# Patient Record
Sex: Male | Born: 1985
Health system: Southern US, Community
[De-identification: ages and names within clinical notes are randomized; demographics above are authoritative.]

---

## 2018-09-25 ENCOUNTER — Emergency Department (HOSPITAL_COMMUNITY): Payer: Self-pay

## 2018-09-25 ENCOUNTER — Inpatient Hospital Stay (HOSPITAL_COMMUNITY): Payer: Self-pay

## 2018-09-25 ENCOUNTER — Inpatient Hospital Stay (HOSPITAL_COMMUNITY)
Admission: EM | Admit: 2018-09-25 | Discharge: 2018-09-29 | DRG: 917 | Disposition: A | Discharge status: Home / Self Care | Payer: Self-pay | Attending: Internal Medicine | Admitting: Internal Medicine

## 2018-09-25 DIAGNOSIS — E872 Acidosis: Secondary | ICD-10-CM | POA: Diagnosis present

## 2018-09-25 DIAGNOSIS — T510X1A Toxic effect of ethanol, accidental (unintentional), initial encounter: Secondary | ICD-10-CM | POA: Diagnosis present

## 2018-09-25 DIAGNOSIS — T50901A Poisoning by unspecified drugs, medicaments and biological substances, accidental (unintentional), initial encounter: Secondary | ICD-10-CM

## 2018-09-25 DIAGNOSIS — F1410 Cocaine abuse, uncomplicated: Secondary | ICD-10-CM | POA: Diagnosis present

## 2018-09-25 DIAGNOSIS — E874 Mixed disorder of acid-base balance: Secondary | ICD-10-CM | POA: Diagnosis present

## 2018-09-25 DIAGNOSIS — T405X1A Poisoning by cocaine, accidental (unintentional), initial encounter: Principal | ICD-10-CM | POA: Diagnosis present

## 2018-09-25 DIAGNOSIS — R402112 Coma scale, eyes open, never, at arrival to emergency department: Secondary | ICD-10-CM | POA: Diagnosis present

## 2018-09-25 DIAGNOSIS — J80 Acute respiratory distress syndrome: Secondary | ICD-10-CM | POA: Diagnosis present

## 2018-09-25 DIAGNOSIS — Z888 Allergy status to other drugs, medicaments and biological substances status: Secondary | ICD-10-CM

## 2018-09-25 DIAGNOSIS — J9601 Acute respiratory failure with hypoxia: Secondary | ICD-10-CM | POA: Diagnosis present

## 2018-09-25 DIAGNOSIS — J690 Pneumonitis due to inhalation of food and vomit: Secondary | ICD-10-CM | POA: Diagnosis present

## 2018-09-25 DIAGNOSIS — Z1159 Encounter for screening for other viral diseases: Secondary | ICD-10-CM

## 2018-09-25 DIAGNOSIS — R402212 Coma scale, best verbal response, none, at arrival to emergency department: Secondary | ICD-10-CM | POA: Diagnosis present

## 2018-09-25 DIAGNOSIS — Z9911 Dependence on respirator [ventilator] status: Secondary | ICD-10-CM

## 2018-09-25 DIAGNOSIS — Z452 Encounter for adjustment and management of vascular access device: Secondary | ICD-10-CM

## 2018-09-25 DIAGNOSIS — I10 Essential (primary) hypertension: Secondary | ICD-10-CM | POA: Diagnosis present

## 2018-09-25 DIAGNOSIS — Y905 Blood alcohol level of 100-119 mg/100 ml: Secondary | ICD-10-CM | POA: Diagnosis present

## 2018-09-25 DIAGNOSIS — Y9259 Other trade areas as the place of occurrence of the external cause: Secondary | ICD-10-CM

## 2018-09-25 DIAGNOSIS — R402312 Coma scale, best motor response, none, at arrival to emergency department: Secondary | ICD-10-CM | POA: Diagnosis present

## 2018-09-25 DIAGNOSIS — N179 Acute kidney failure, unspecified: Secondary | ICD-10-CM | POA: Diagnosis present

## 2018-09-25 DIAGNOSIS — T407X1A Poisoning by cannabis (derivatives), accidental (unintentional), initial encounter: Secondary | ICD-10-CM | POA: Diagnosis present

## 2018-09-25 LAB — TRIGLYCERIDES: Triglycerides: 92 mg/dL (ref <150)

## 2018-09-25 LAB — MAGNESIUM: Magnesium: 1.6 mg/dL — ABNORMAL LOW (ref 1.7–2.4)

## 2018-09-25 LAB — COMPREHENSIVE METABOLIC PANEL
ALT: 23 U/L (ref 0–44)
ALT: 24 U/L (ref 0–44)
AST: 33 U/L (ref 15–41)
AST: 33 U/L (ref 15–41)
Albumin: 3.9 g/dL (ref 3.5–5.0)
Albumin: 3.6 g/dL (ref 3.5–5.0)
Alkaline Phosphatase: 82 U/L (ref 38–126)
Alkaline Phosphatase: 73 U/L (ref 38–126)
Anion gap: 17 — ABNORMAL HIGH (ref 5–15)
Anion gap: 9 (ref 5–15)
BUN: 13 mg/dL (ref 6–20)
BUN: 13 mg/dL (ref 6–20)
CO2: 19 mmol/L — ABNORMAL LOW (ref 22–32)
CO2: 23 mmol/L (ref 22–32)
Calcium: 8.8 mg/dL — ABNORMAL LOW (ref 8.9–10.3)
Calcium: 8.7 mg/dL — ABNORMAL LOW (ref 8.9–10.3)
Chloride: 107 mmol/L (ref 98–111)
Chloride: 110 mmol/L (ref 98–111)
Creatinine, Ser: 1.29 mg/dL — ABNORMAL HIGH (ref 0.61–1.24)
Creatinine, Ser: 1.31 mg/dL — ABNORMAL HIGH (ref 0.61–1.24)
GFR calc Af Amer: >60 mL/min (ref >60)
GFR calc Af Amer: >60 mL/min (ref >60)
GFR calc non Af Amer: >60 mL/min (ref >60)
GFR calc non Af Amer: >60 mL/min (ref >60)
Glucose, Bld: 241 mg/dL — ABNORMAL HIGH (ref 70–99)
Glucose, Bld: 101 mg/dL — ABNORMAL HIGH (ref 70–99)
Potassium: 4 mmol/L (ref 3.5–5.1)
Potassium: 4.8 mmol/L (ref 3.5–5.1)
Sodium: 143 mmol/L (ref 135–145)
Sodium: 142 mmol/L (ref 135–145)
Total Bilirubin: 0.5 mg/dL (ref 0.3–1.2)
Total Bilirubin: 0.6 mg/dL (ref 0.3–1.2)
Total Protein: 7.1 g/dL (ref 6.5–8.1)
Total Protein: 6.6 g/dL (ref 6.5–8.1)

## 2018-09-25 LAB — POCT I-STAT 7, (LYTES, BLD GAS, ICA,H+H)
Acid-base deficit: 11 mmol/L — ABNORMAL HIGH (ref 0.0–2.0)
Acid-base deficit: 9 mmol/L — ABNORMAL HIGH (ref 0.0–2.0)
Bicarbonate: 17.7 mmol/L — ABNORMAL LOW (ref 20.0–28.0)
Bicarbonate: 17.3 mmol/L — ABNORMAL LOW (ref 20.0–28.0)
Calcium, Ion: 1.26 mmol/L (ref 1.15–1.40)
Calcium, Ion: 1.24 mmol/L (ref 1.15–1.40)
HCT: 53 % — ABNORMAL HIGH (ref 39.0–52.0)
HCT: 53 % — ABNORMAL HIGH (ref 39.0–52.0)
Hemoglobin: 18 g/dL — ABNORMAL HIGH (ref 13.0–17.0)
Hemoglobin: 18 g/dL — ABNORMAL HIGH (ref 13.0–17.0)
O2 Saturation: 88 %
O2 Saturation: 97 %
Potassium: 4.4 mmol/L (ref 3.5–5.1)
Potassium: 4.6 mmol/L (ref 3.5–5.1)
Sodium: 141 mmol/L (ref 135–145)
Sodium: 140 mmol/L (ref 135–145)
TCO2: 19 mmol/L — ABNORMAL LOW (ref 22–32)
TCO2: 18 mmol/L — ABNORMAL LOW (ref 22–32)
pCO2 arterial: 48.6 mmHg — ABNORMAL HIGH (ref 32.0–48.0)
pCO2 arterial: 36.9 mmHg (ref 32.0–48.0)
pH, Arterial: 7.168 — CL (ref 7.350–7.450)
pH, Arterial: 7.279 — ABNORMAL LOW (ref 7.350–7.450)
pO2, Arterial: 70 mmHg — ABNORMAL LOW (ref 83.0–108.0)
pO2, Arterial: 100 mmHg (ref 83.0–108.0)

## 2018-09-25 LAB — CBC
HCT: 53.2 % — ABNORMAL HIGH (ref 39.0–52.0)
Hemoglobin: 16.9 g/dL (ref 13.0–17.0)
MCH: 29 pg (ref 26.0–34.0)
MCHC: 31.8 g/dL (ref 30.0–36.0)
MCV: 91.4 fL (ref 80.0–100.0)
Platelets: 199 10*3/uL (ref 150–400)
RBC: 5.82 MIL/uL — ABNORMAL HIGH (ref 4.22–5.81)
RDW: 14.5 % (ref 11.5–15.5)
WBC: 5.5 10*3/uL (ref 4.0–10.5)
nRBC: 0 % (ref 0.0–0.2)

## 2018-09-25 LAB — CBC WITH DIFFERENTIAL/PLATELET
Abs Immature Granulocytes: 0.29 10*3/uL — ABNORMAL HIGH (ref 0.00–0.07)
Basophils Absolute: 0.1 10*3/uL (ref 0.0–0.1)
Basophils Relative: 1 %
Eosinophils Absolute: 0.2 10*3/uL (ref 0.0–0.5)
Eosinophils Relative: 1 %
HCT: 50.4 % (ref 39.0–52.0)
Hemoglobin: 15.6 g/dL (ref 13.0–17.0)
Immature Granulocytes: 1 %
Lymphocytes Relative: 15 %
Lymphs Abs: 3 10*3/uL (ref 0.7–4.0)
MCH: 29 pg (ref 26.0–34.0)
MCHC: 31 g/dL (ref 30.0–36.0)
MCV: 93.7 fL (ref 80.0–100.0)
Monocytes Absolute: 0.3 10*3/uL (ref 0.1–1.0)
Monocytes Relative: 2 %
Neutro Abs: 16.7 10*3/uL — ABNORMAL HIGH (ref 1.7–7.7)
Neutrophils Relative %: 80 %
Platelets: 198 10*3/uL (ref 150–400)
RBC: 5.38 MIL/uL (ref 4.22–5.81)
RDW: 14.5 % (ref 11.5–15.5)
WBC: 20.7 10*3/uL — ABNORMAL HIGH (ref 4.0–10.5)
nRBC: 0 % (ref 0.0–0.2)

## 2018-09-25 LAB — PROTIME-INR
INR: 1.1 (ref 0.8–1.2)
INR: 1.1 (ref 0.8–1.2)
Prothrombin Time: 13.7 seconds (ref 11.4–15.2)
Prothrombin Time: 13.8 seconds (ref 11.4–15.2)

## 2018-09-25 LAB — RAPID URINE DRUG SCREEN, HOSP PERFORMED
Amphetamines: NOT DETECTED
Barbiturates: NOT DETECTED
Benzodiazepines: NOT DETECTED
Cocaine: POSITIVE — AB
Opiates: NOT DETECTED
Tetrahydrocannabinol: POSITIVE — AB

## 2018-09-25 LAB — PROCALCITONIN: Procalcitonin: 1.43 ng/mL

## 2018-09-25 LAB — APTT
aPTT: 27 seconds (ref 24–36)
aPTT: 25 seconds (ref 24–36)

## 2018-09-25 LAB — TYPE AND SCREEN
ABO/RH(D): O POS
Antibody Screen: NEGATIVE

## 2018-09-25 LAB — SARS CORONAVIRUS 2 BY RT PCR (HOSPITAL ORDER, PERFORMED IN ~~LOC~~ HOSPITAL LAB): SARS Coronavirus 2: NEGATIVE

## 2018-09-25 LAB — LACTIC ACID, PLASMA
Lactic Acid, Venous: 5.8 mmol/L (ref 0.5–1.9)
Lactic Acid, Venous: 3.6 mmol/L (ref 0.5–1.9)
Lactic Acid, Venous: 2.8 mmol/L (ref 0.5–1.9)

## 2018-09-25 LAB — ETHANOL: Alcohol, Ethyl (B): 109 mg/dL — ABNORMAL HIGH (ref <10)

## 2018-09-25 LAB — PHOSPHORUS: Phosphorus: 3.5 mg/dL (ref 2.5–4.6)

## 2018-09-25 LAB — AMYLASE: Amylase: 116 U/L — ABNORMAL HIGH (ref 28–100)

## 2018-09-25 LAB — MRSA PCR SCREENING: MRSA by PCR: NEGATIVE

## 2018-09-25 LAB — CORTISOL: Cortisol, Plasma: 18.7 ug/dL

## 2018-09-25 LAB — ABO/RH: ABO/RH(D): O POS

## 2018-09-25 MED ORDER — PROPOFOL 1000 MG/100ML IV EMUL
INTRAVENOUS | Status: AC
  Filled 2018-09-25: qty 100

## 2018-09-25 MED ORDER — SODIUM CHLORIDE 0.9 % IV SOLN
3.0000 g | Freq: Four times a day (QID) | INTRAVENOUS | Status: DC
  Administered 2018-09-25 – 2018-09-29 (×16): 3 g via INTRAVENOUS
  Filled 2018-09-25 (×2): qty 3
  Filled 2018-09-25 (×3): qty 8
  Filled 2018-09-25 (×2): qty 3
  Filled 2018-09-25: qty 8
  Filled 2018-09-25 (×3): qty 3
  Filled 2018-09-25 (×2): qty 8
  Filled 2018-09-25: qty 3
  Filled 2018-09-25: qty 8
  Filled 2018-09-25 (×2): qty 3
  Filled 2018-09-25: qty 8
  Filled 2018-09-25 (×2): qty 3
  Filled 2018-09-25: qty 8

## 2018-09-25 MED ORDER — CHLORHEXIDINE GLUCONATE CLOTH 2 % EX PADS
6.0000 | MEDICATED_PAD | Freq: Every day | CUTANEOUS | Status: DC
  Administered 2018-09-25 (×2): 6 via TOPICAL

## 2018-09-25 MED ORDER — ORAL CARE MOUTH RINSE
15.0000 mL | OROMUCOSAL | Status: DC
  Administered 2018-09-25 – 2018-09-26 (×13): 15 mL via OROMUCOSAL

## 2018-09-25 MED ORDER — ACETAMINOPHEN 325 MG PO TABS
650.0000 mg | ORAL_TABLET | ORAL | Status: DC | PRN
  Administered 2018-09-25: 650 mg via ORAL
  Filled 2018-09-25: qty 2

## 2018-09-25 MED ORDER — FENTANYL CITRATE (PF) 100 MCG/2ML IJ SOLN: 50.0000 ug | Freq: Once | INTRAMUSCULAR | Status: DC

## 2018-09-25 MED ORDER — FENTANYL 2500MCG IN NS 250ML (10MCG/ML) PREMIX INFUSION
25.0000 ug/h | INTRAVENOUS | Status: DC
  Administered 2018-09-25: 100 ug/h via INTRAVENOUS

## 2018-09-25 MED ORDER — SODIUM CHLORIDE 0.9 % IV SOLN
INTRAVENOUS | Status: DC
  Administered 2018-09-25 – 2018-09-27 (×2): via INTRAVENOUS

## 2018-09-25 MED ORDER — ROCURONIUM BROMIDE 50 MG/5ML IV SOLN
INTRAVENOUS | Status: AC | PRN
  Administered 2018-09-25: 100 mg via INTRAVENOUS

## 2018-09-25 MED ORDER — ETOMIDATE 2 MG/ML IV SOLN
INTRAVENOUS | Status: AC | PRN
  Administered 2018-09-25: 25 mg via INTRAVENOUS

## 2018-09-25 MED ORDER — FAMOTIDINE IN NACL 20-0.9 MG/50ML-% IV SOLN
20.0000 mg | Freq: Two times a day (BID) | INTRAVENOUS | Status: DC
  Administered 2018-09-25 – 2018-09-26 (×3): 20 mg via INTRAVENOUS
  Filled 2018-09-25 (×3): qty 50

## 2018-09-25 MED ORDER — NALOXONE HCL 2 MG/2ML IJ SOSY
2.0000 mg | PREFILLED_SYRINGE | Freq: Once | INTRAMUSCULAR | Status: AC
  Administered 2018-09-25: 09:00:00 2 mg via INTRAVENOUS

## 2018-09-25 MED ORDER — PROPOFOL 1000 MG/100ML IV EMUL
0.0000 ug/kg/min | INTRAVENOUS | Status: DC
  Administered 2018-09-25: 09:00:00 5 ug/kg/min via INTRAVENOUS
  Administered 2018-09-25: 15:00:00 30 ug/kg/min via INTRAVENOUS
  Administered 2018-09-26: 04:00:00 40 ug/kg/min via INTRAVENOUS
  Administered 2018-09-26: 08:00:00 45 ug/kg/min via INTRAVENOUS
  Administered 2018-09-26: 40 ug/kg/min via INTRAVENOUS
  Filled 2018-09-25 (×5): qty 100

## 2018-09-25 MED ORDER — FENTANYL 2500MCG IN NS 250ML (10MCG/ML) PREMIX INFUSION
0.0000 ug/h | INTRAVENOUS | Status: DC
  Administered 2018-09-25: 10:00:00 100 ug/h via INTRAVENOUS
  Filled 2018-09-25: qty 250

## 2018-09-25 MED ORDER — FENTANYL BOLUS VIA INFUSION
50.0000 ug | INTRAVENOUS | Status: DC | PRN
  Filled 2018-09-25: qty 50

## 2018-09-25 MED ORDER — ACETAMINOPHEN 325 MG PO TABS
650.0000 mg | ORAL_TABLET | ORAL | Status: DC | PRN
  Administered 2018-09-26: 11:00:00 650 mg
  Filled 2018-09-25: qty 2

## 2018-09-25 MED ORDER — PANTOPRAZOLE SODIUM 40 MG IV SOLR
40.0000 mg | Freq: Every day | INTRAVENOUS | Status: DC
  Administered 2018-09-25: 22:00:00 40 mg via INTRAVENOUS
  Filled 2018-09-25: qty 40

## 2018-09-25 MED ORDER — ENOXAPARIN SODIUM 40 MG/0.4ML ~~LOC~~ SOLN
40.0000 mg | SUBCUTANEOUS | Status: DC
  Administered 2018-09-25: 40 mg via SUBCUTANEOUS
  Filled 2018-09-25: qty 0.4

## 2018-09-25 MED ORDER — ONDANSETRON HCL 4 MG/2ML IJ SOLN
4.0000 mg | Freq: Four times a day (QID) | INTRAMUSCULAR | Status: DC | PRN
  Administered 2018-09-29: 05:00:00 4 mg via INTRAVENOUS
  Filled 2018-09-25: qty 2

## 2018-09-25 MED ORDER — CHLORHEXIDINE GLUCONATE 0.12% ORAL RINSE (MEDLINE KIT)
15.0000 mL | Freq: Two times a day (BID) | OROMUCOSAL | Status: DC
  Administered 2018-09-25 – 2018-09-26 (×3): 15 mL via OROMUCOSAL

## 2018-09-25 NOTE — ED Notes (Signed)
Pt girlfriend Docia Furl [phone]

## 2018-09-25 NOTE — ED Notes (Signed)
RT does believes air is leaking around et balloon.  MD notified, et tube removed and re-placed.  Balloon was not inflating correctly upon assessment after removal.  Pt tolerated well.

## 2018-09-25 NOTE — ED Notes (Signed)
1L NS given during intubation.  MD aware.

## 2018-09-25 NOTE — H&P (Signed)
PULMONARY / CRITICAL CARE MEDICINE   NAME:  Richard Rangel, MRN:  [id number], DOB:  04/18/1984, LOS: 0 ADMISSION DATE:  09/25/2018, CONSULTATION DATE: 09/25/2018 REFERRING MD: Emergency department physician, CHIEF COMPLAINT: ARDS secondary to substance abuse.  BRIEF HISTORY:    33 year old found down polysubstance abuse intubated emergency department HISTORY OF PRESENT ILLNESS   33 year old male found semiconscious and hotel room.  Transported to the emergency department at [facility].  He was near obtunded therefore was intubated for placed on mechanical ventilatory support.  His alcohol level is greater than 100, positive for cocaine and marijuana.  He is currently on propofol and fentanyl drips and poorly responsive.  He also has significant kidney injury chest x-ray consistent with ARDS therefore review admitted and intubated on mechanical ventilatory support to [facility] intensive care unit with pulmonary critical care is admitting physician. SIGNIFICANT PAST MEDICAL HISTORY     SIGNIFICANT EVENTS:   STUDIES:   09/25/2018 CT of the head no acute intracranial abnormality 09/25/2018 chest x-ray shows dense bilateral airspace disease consistent with ARDS with endotracheal tube in appropriate position CULTURES:  09/25/2018 blood cultures x2>> 09/25/2018 respiratory culture>> 09/25/2018 urine culture>>  ANTIBIOTICS:  09/25/2018 Unasyn>>  LINES/TUBES:   09/25/2018 endotracheal tube per EDP>> CONSULTANTS:   SUBJECTIVE:  33 year old male admitted with substance abuse overdose.  CONSTITUTIONAL: BP 104/90 (BP Location: Right Arm)   Pulse (!) 116   Temp 98.1 F (36.7 C)   Resp (!) 24   Ht 5\' 11"  (1.803 m)   Wt 100 kg   SpO2 96%   BMI 30.75 kg/m   No intake/output data recorded.     Vent Mode: PRVC FiO2 (%):  [100 %] 100 % Set Rate:  [18 bmp-24 bmp] 24 bmp Vt Set:  [600 mL] 600 mL PEEP:  [12 cmH20] 12 cmH20 Plateau Pressure:  [31 cmH20] 31 cmH20  PHYSICAL  EXAM: General: Well-nourished well-developed male who is currently on full mechanical ventilatory support and sedated with propofol and fentanyl Neuro: Was recently given neuromuscular blockade for intubation HEENT: Endotracheal tube is in place, no JVD or lymphadenopathy is appreciated Cardiovascular: Heart sounds are regular regular rate and rhythm sinus tach 120 Lungs: Diminished throughout Abdomen: Soft nontender positive bowel sounds Musculoskeletal: Intact Skin: Multiple tattoos no edema is appreciated Gu: Foley catheter is in place with amber urine  RESOLVED PROBLEM LIST   ASSESSMENT AND PLAN   Vent dependent respiratory failure in the setting of adult respiratory distress syndrome positive for cocaine questionable aspiration. Admit to the intensive care unit Vent bundle Empiric antimicrobial therapy with Unasyn Sedation for vent tolerance with RA SS score of -1, fentany-propofol Fluid hydration utilizing normal saline due to elevated lactic acid Serial chest x-rays Wean FiO2 as tolerated  Suspected drug overdose positive for cocaine and marijuana on admission with alcohol level of 109. Drug counseling once stabilized and extubated Folic acid and thiamine Sedation as needed PRN benzodiazepines  Metabolic acidosis in the setting of period of hypoventilation substance abuse elevated creatinine. Monitor lactic acids Fluid resuscitation Monitor creatinine if continues to rise will need a renal ultrasound  Elevated white count in the setting of ARDS picture of chest x-ray consistent with possible aspiration  Antimicrobial therapy with Unasyn Panculture    SUMMARY OF TODAY'S PLAN:  Intubated by the ER physician after being found down in a motel room.  Positive for cocaine.   COVID 19 negative.  Pulmonary critical care asked to admit.  Best Practice / Goals of Care /  Disposition.   DVT PROPHYLAXIS: Low molecular weight heparin SUP: PPI NUTRITION: N.p.o. MOBILITY:  Bedrest GOALS OF CARE: Full code FAMILY DISCUSSIONS: No family available at this time DISPOSITION to ICU  LABS  Glucose No results for input(s): GLUCAP in the last 168 hours.  BMET Recent Labs  Lab 09/25/18 0901 09/25/18 1056  NA 143 141  K 4.0 4.4  CL 107  --   CO2 19*  --   BUN 13  --   CREATININE 1.29*  --   GLUCOSE 241*  --     Liver Enzymes Recent Labs  Lab 09/25/18 0901  AST 33  ALT 23  ALKPHOS 82  BILITOT 0.5  ALBUMIN 3.9    Electrolytes Recent Labs  Lab 09/25/18 0901  CALCIUM 8.8*    CBC Recent Labs  Lab 09/25/18 0901 09/25/18 1056  WBC 20.7*  --   HGB 15.6 18.0*  HCT 50.4 53.0*  PLT 198  --     ABG Recent Labs  Lab 09/25/18 1056  PHART 7.168*  PCO2ART 48.6*  PO2ART 70.0*    Coag's Recent Labs  Lab 09/25/18 0901  APTT 27  INR 1.1    Sepsis Markers Recent Labs  Lab 09/25/18 0901  LATICACIDVEN 5.8*    Cardiac Enzymes No results for input(s): TROPONINI, PROBNP in the last 168 hours.  PAST MEDICAL HISTORY :   He  has no past medical history on file.  PAST SURGICAL HISTORY:  He  has no past surgical history on file.  Not on File  No current facility-administered medications on file prior to encounter.    No current outpatient medications on file prior to encounter.    FAMILY HISTORY:   His family history is not on file.  SOCIAL HISTORY:  He    REVIEW OF SYSTEMS:    NA  Brett CanalesSteve  ACNP [facility] Pager [phone] till 1 pm If no answer page [phone] 09/25/2018, 11:48 AM

## 2018-09-25 NOTE — ED Triage Notes (Signed)
Pt from hotel via ems; per wife, pt snoring while sleeping, checked about 45 minutes later, pt unresponsive; etoh and cocaine use, possible heroine use; 2 mg narcan given PTA; RR 8 on ems arrival, increased to 20 after narcan; CBG 318, pupils pinpoint; LSN around midnight last night; sats 51% on ems arrival; 96 % on NRB; hx HTN, substance abuse  BP 140/80 RR 20  96% NRB HR120

## 2018-09-25 NOTE — Progress Notes (Signed)
Please note: temp foley reads 39.4. Rectal probe with cooling blanket reads 38.8.

## 2018-09-25 NOTE — Progress Notes (Signed)
The patient was received from [facility], RRT. The patient is resting on ordered vent settings. Patient transported without incident.

## 2018-09-25 NOTE — Progress Notes (Signed)
Initial Nutrition Assessment   RD working remotely.  DOCUMENTATION CODES:   Obesity unspecified  INTERVENTION:  If pt remains intubated >/= 24-48 hours, Recommend TF with Vital High Protein at goal rate of 20 ml/h (480 ml per day) and Prostat 60 ml QID   Tube feeding regimen with current propofol rate to provide 1755 kcals, 162 gm protein (100% of protein needs), 403 ml free water daily.  Calorie needs exceed estimated nutrition needs due to current propofol rate.   NUTRITION DIAGNOSIS:   Inadequate oral intake related to inability to eat as evidenced by NPO status.  GOAL:   Provide needs based on ASPEN/SCCM guidelines  MONITOR:   Vent status, Skin, Weight trends, Labs, I & O's  REASON FOR ASSESSMENT:   Consult, Ventilator    ASSESSMENT:   33 year old man presenting after being found down in setting of polysubstance abuse. Alcohol level is greater than 100, positive for cocaine and marijuana.  CXR c/w aspiration and ARDS.  Patient is currently intubated on ventilator support MV: 14.7 L/min Temp (24hrs), Avg:100.2 F (37.9 C), Min:95.7 F (35.4 C), Max:102.7 F (39.3 C)  Propofol: 18 ml/hr which provides 475 kcal/day.  If unable to extubate, recommend initiation of enteral nutrition. Pt with OGT in place. Unable to complete Nutrition-Focused physical exam at this time. Labs and medications reviewed.   Diet Order:   Diet Order            Diet NPO time specified  Diet effective now              EDUCATION NEEDS:   Not appropriate for education at this time  Skin:  Skin Assessment: Reviewed RN Assessment  Last BM:  Unknown  Height:   Ht Readings from Last 1 Encounters:  09/25/18 5\' 11"  (1.803 m)    Weight:   Wt Readings from Last 1 Encounters:  09/25/18 100 kg    Ideal Body Weight:  78.18 kg  BMI:  Body mass index is 30.75 kg/m.  Estimated Nutritional Needs:   Kcal:  1100-1400  Protein:  >/= 156 grams  Fluid:  >/= 2  L/day    Corrin Parker, MS, RD, LDN Pager # [phone] After hours/ weekend pager # [phone]

## 2018-09-25 NOTE — ED Provider Notes (Signed)
[facility]   CSN: [id number] Arrival date & time: 09/25/18  0845     History   Chief Complaint No chief complaint on file.   HPI Richard Rangel is a 33 y.o. male.     33 year old male presents unresponsive from hotel room.  Patient found with low O2 saturation as well as snoring respirations.  According to EMS, patient possibly ingested cocaine, alcohol, heroin.  Patient received Narcan with minimal relief.  CBG was 300.  Patient transported here for further management     No past medical history on file.  There are no active problems to display for this patient.         Home Medications    Prior to Admission medications   Not on File    Family History No family history on file.  Social History Social History   Tobacco Use  . Smoking status: Not on file  Substance Use Topics  . Alcohol use: Not on file  . Drug use: Not on file     Allergies   Patient has no allergy information on record.   Review of Systems Review of Systems  Unable to perform ROS: Acuity of condition     Physical Exam Updated Vital Signs BP (!) 172/123 (BP Location: Right Arm)   Pulse (!) 163   Temp (!) 96.5 F (35.8 C) (Rectal)   Resp 20   Ht 1.854 m (6\' 1" )   Wt 100 kg   SpO2 (!) 88%   BMI 29.09 kg/m   Physical Exam Vitals signs and nursing note reviewed.  Constitutional:      General: He is not in acute distress.    Appearance: Normal appearance. He is well-developed. He is not toxic-appearing.  HENT:     Head: Normocephalic and atraumatic.  Eyes:     General: Lids are normal.     Conjunctiva/sclera: Conjunctivae normal.     Pupils: Pupils are equal, round, and reactive to light.  Neck:     Musculoskeletal: Normal range of motion and neck supple.     Thyroid: No thyroid mass.     Trachea: No tracheal deviation.  Cardiovascular:     Rate and Rhythm: Regular rhythm. Tachycardia present.     Heart sounds:  Normal heart sounds. No murmur. No gallop.   Pulmonary:     Effort: Tachypnea and respiratory distress present.     Breath sounds: No stridor. Examination of the right-upper field reveals decreased breath sounds and rhonchi. Examination of the left-upper field reveals decreased breath sounds and rhonchi. Decreased breath sounds and rhonchi present. No wheezing or rales.  Abdominal:     General: Bowel sounds are normal. There is no distension.     Palpations: Abdomen is soft.     Tenderness: There is no abdominal tenderness. There is no rebound.  Musculoskeletal: Normal range of motion.        General: No tenderness.  Skin:    General: Skin is warm and dry.     Findings: No abrasion or rash.  Neurological:     Mental Status: He is unresponsive.     GCS: GCS eye subscore is 1. GCS verbal subscore is 1. GCS motor subscore is 1.     Motor: No tremor.  Psychiatric:        Attention and Perception: He is inattentive.      ED Treatments / Results  Labs (all labs ordered are listed, but only abnormal results  are displayed) Labs Reviewed  SARS CORONAVIRUS 2 (HOSPITAL ORDER, [facility])  RAPID URINE DRUG SCREEN, HOSP PERFORMED  ETHANOL  CBC WITH DIFFERENTIAL/PLATELET  COMPREHENSIVE METABOLIC PANEL  PROTIME-INR  APTT  LACTIC ACID, PLASMA  TRIGLYCERIDES  TYPE AND SCREEN    EKG EKG Interpretation  Date/Time:  Friday September 25 2018 08:48:44 EDT Ventricular Rate:  119 PR Interval:    QRS Duration: 93 QT Interval:  326 QTC Calculation: 459 R Axis:   63 Text Interpretation:  Sinus tachycardia Probable left atrial enlargement Borderline repolarization abnormality No old tracing to compare Confirmed by Lacretia Leigh ([id number]) on 09/25/2018 9:20:40 AM   Radiology No results found.  Procedures Procedure Name: Intubation Date/Time: 09/25/2018 10:06 AM Performed by: Lacretia Leigh, MD Pre-anesthesia Checklist: Patient identified, Patient being monitored,  Emergency Drugs available, Timeout performed and Suction available Oxygen Delivery Method: Ambu bag Preoxygenation: Pre-oxygenation with 100% oxygen Induction Type: Rapid sequence Ventilation: Mask ventilation without difficulty Laryngoscope Size: Glidescope and 5 Grade View: Grade I Tube size: 7.5 mm Number of attempts: 2 Airway Equipment and Method: Video-laryngoscopy Placement Confirmation: ETT inserted through vocal cords under direct vision,  CO2 detector and Breath sounds checked- equal and bilateral Secured at: 26 cm Tube secured with: ETT holder Dental Injury: Teeth and Oropharynx as per pre-operative assessment  Difficulty Due To: Difficulty was anticipated      (including critical care time)  Medications Ordered in ED Medications  0.9 %  sodium chloride infusion (has no administration in time range)  fentaNYL 2597mcg in NS 227mL (23mcg/ml) infusion-PREMIX (has no administration in time range)  propofol (DIPRIVAN) 1000 MG/100ML infusion (5 mcg/kg/min  100 kg Intravenous New Bag/Given 09/25/18 0925)  fentaNYL (SUBLIMAZE) injection 50 mcg (has no administration in time range)  fentaNYL 2528mcg in NS 233mL (64mcg/ml) infusion-PREMIX (100 mcg/hr Intravenous New Bag/Given 09/25/18 0935)  fentaNYL (SUBLIMAZE) bolus via infusion 50 mcg (has no administration in time range)  propofol (DIPRIVAN) 1000 MG/100ML infusion (has no administration in time range)     Initial Impression / Assessment and Plan / ED Course  I have reviewed the triage vital signs and the nursing notes.  Pertinent labs & imaging results that were available during my care of the patient were reviewed by me and considered in my medical decision making (see chart for details).        Patients ET tube required to be changed due to a leak.  This was done via a bougie.  Placement confirmed.  Given sedation protocol.  Head CT without acute findings.  Cover test negative.  Chest x-ray consistent with likely ards.   Ventilator settings adjusted.  Elevated lactate noted.  Discussed with Dr. Tamala Julian in critical care and he will come to admit the patient  CRITICAL CARE Performed by: Leota Jacobsen Total critical care time: 75 minutes Critical care time was exclusive of separately billable procedures and treating other patients. Critical care was necessary to treat or prevent imminent or life-threatening deterioration. Critical care was time spent personally by me on the following activities: development of treatment plan with patient and/or surrogate as well as nursing, discussions with consultants, evaluation of patient's response to treatment, examination of patient, obtaining history from patient or surrogate, ordering and performing treatments and interventions, ordering and review of laboratory studies, ordering and review of radiographic studies, pulse oximetry and re-evaluation of patient's condition.  Final Clinical Impressions(s) / ED Diagnoses   Final diagnoses:  None    ED Discharge Orders  None       Lorre NickAllen, Gabriellia Rempel, MD 09/25/18 1103

## 2018-09-25 NOTE — Progress Notes (Signed)
Pharmacy Antibiotic Note  Richard Rangel is a 33 y.o. male admitted on 09/25/2018 with CAP/aspiration.  Pharmacy has been consulted for Unasyn dosing. Presented unresponsive, WBC 20.7, Scr 1.29, temp 37.4.   Plan: Start Unasyn 3gm IV q6h  Height: 5\' 11"  (180.3 cm) Weight: 220 lb 7.4 oz (100 kg) IBW/kg (Calculated) : 75.3  Temp (24hrs), Avg:97.2 F (36.2 C), Min:95.7 F (35.4 C), Max:98.1 F (36.7 C)  Recent Labs  Lab 09/25/18 0901  WBC 20.7*  CREATININE 1.29*  LATICACIDVEN 5.8*    Estimated Creatinine Clearance: 99.1 mL/min (A) (by C-G formula based on SCr of 1.29 mg/dL (H)).    Not on File  Antimicrobials this admission: 7/3 Unasyn 3 gm IV q6h >>   Microbiology results: 7/3 BCx: ordered 7/3 UCx: ordered 7/3 Sputum: ordered  Thank you for allowing pharmacy to be a part of this patient's care.  Acey Lav, PharmD  PGY1 Pharmacy Resident [facility] [phone] 09/25/2018 12:05 PM

## 2018-09-25 NOTE — ED Notes (Signed)
Attempted report 

## 2018-09-25 NOTE — Procedures (Signed)
Central Venous Catheter Insertion Procedure Note Richard Rangel [id number] 07-14-1985  Procedure: Insertion of Central Venous Catheter Indications: Assessment of intravascular volume, Drug and/or fluid administration and Frequent blood sampling  Procedure Details Consent: Risks of procedure as well as the alternatives and risks of each were explained to the (patient/caregiver).  Consent for procedure obtained. Time Out: Verified patient identification, verified procedure, site/side was marked, verified correct patient position, special equipment/implants available, medications/allergies/relevent history reviewed, required imaging and test results available.  Performed  Maximum sterile technique was used including antiseptics, cap, gloves, gown, hand hygiene, mask and sheet. Skin prep: Chlorhexidine; local anesthetic administered A antimicrobial bonded/coated triple lumen catheter was placed in the right internal jugular vein to 15 cm using the Seldinger technique. Sutured x3, biopatch applied.   Evaluation Blood flow good Complications: No apparent complications Patient did tolerate procedure well. Chest X-ray ordered to verify placement.  CXR: pending.   Procedure performed under direct supervision of Dr. Halford Chessman and with ultrasound guidance for real time vessel cannulation.     [name], NP-C [facility] Pgr: [phone] or if no answer [phone] 09/25/2018, 4:29 PM

## 2018-09-25 NOTE — ED Notes (Signed)
Culture sent in addition to UA 

## 2018-09-25 NOTE — ED Notes (Signed)
ED TO INPATIENT HANDOFF REPORT  ED Nurse Name and Phone #: Leatha Gildingmallory [phone]  S Name/Age/Gender Richard Rangel 33 y.o. male Room/Bed: 025C/025C  Code Status   Code Status: Full Code  Home/SNF/Other Home Patient oriented to: self, place, time and situation Is this baseline? Yes   Triage Complete: Triage complete  Chief Complaint Unresponsive/ OD  Triage Note Pt from hotel via ems; per wife, pt snoring while sleeping, checked about 45 minutes later, pt unresponsive; etoh and cocaine use, possible heroine use; 2 mg narcan given PTA; RR 8 on ems arrival, increased to 20 after narcan; CBG 318, pupils pinpoint; LSN around midnight last night; sats 51% on ems arrival; 96 % on NRB; hx HTN, substance abuse  BP 140/80 RR 20  96% NRB HR120   Allergies Not on File  Level of Care/Admitting Diagnosis ED Disposition    ED Disposition Condition Comment   Admit  [facility] [[id number]]  Level of Care: ICU [6]  Covid Evaluation: Confirmed COVID Negative  Diagnosis: ARDS (adult respiratory distress syndrome) [facility](HCC) [[id number]]) [[id number]]  Admitting Physician: Lorin GlassSMITH, DANIEL C [[id number]]  Attending Physician: PCCM, MD [phone]]  Estimated length of stay: 3 - 4 days  Certification:: I certify this patient will need inpatient services for at least 2 midnights  PT Class (Do Not Modify): Inpatient [101]  PT Acc Code (Do Not Modify): Private [1]       B Medical/Surgery History No past medical history on file.    A IV Location/Drains/Wounds Patient Lines/Drains/Airways Status   Active Line/Drains/Airways    Name:   Placement date:   Placement time:   Site:   Days:   Peripheral IV 09/25/18 Left Hand   09/25/18    -    Hand   less than 1   Urethral Catheter rn brenda Temperature probe 16 Fr.   09/25/18    1002    Temperature probe   less than 1   Airway 7.5 mm   09/25/18    0933     less than 1          Intake/Output Last 24 hours  Intake/Output Summary (Last 24  hours) at 09/25/2018 1158 Last data filed at 09/25/2018 1003 Gross per 24 hour  Intake -  Output 400 ml  Net -400 ml    Labs/Imaging Results for orders placed or performed during the hospital encounter of 09/25/18 (from the past 48 hour(s))  Rapid urine drug screen (hospital performed)     Status: Abnormal   Collection Time: 09/25/18  9:01 AM  Result Value Ref Range   Opiates NONE DETECTED NONE DETECTED   Cocaine POSITIVE (A) NONE DETECTED   Benzodiazepines NONE DETECTED NONE DETECTED   Amphetamines NONE DETECTED NONE DETECTED   Tetrahydrocannabinol POSITIVE (A) NONE DETECTED   Barbiturates NONE DETECTED NONE DETECTED    Comment: (NOTE) DRUG SCREEN FOR MEDICAL PURPOSES ONLY.  IF CONFIRMATION IS NEEDED FOR ANY PURPOSE, NOTIFY LAB WITHIN 5 DAYS. LOWEST DETECTABLE LIMITS FOR URINE DRUG SCREEN Drug Class                     Cutoff (ng/mL) Amphetamine and metabolites    1000 Barbiturate and metabolites    200 Benzodiazepine                 200 Tricyclics and metabolites     300 Opiates and metabolites        300 Cocaine and metabolites  300 THC                            50 Performed at [facility], [address] [place], [place] [address]   Ethanol     Status: Abnormal   Collection Time: 09/25/18  9:01 AM  Result Value Ref Range   Alcohol, Ethyl (B) 109 (H) <10 mg/dL    Comment: (NOTE) Lowest detectable limit for serum alcohol is 10 mg/dL. For medical purposes only. Performed at [facility], [address] [place], [place] [address]   CBC with Differential/Platelet     Status: Abnormal   Collection Time: 09/25/18  9:01 AM  Result Value Ref Range   WBC 20.7 (H) 4.0 - 10.5 K/uL   RBC 5.38 4.22 - 5.81 MIL/uL   Hemoglobin 15.6 13.0 - 17.0 g/dL   HCT 09.850.4 11.939.0 - 14.752.0 %   MCV 93.7 80.0 - 100.0 fL   MCH 29.0 26.0 - 34.0 pg   MCHC 31.0 30.0 - 36.0 g/dL   RDW 82.914.5 56.211.5 - 13.015.5 %   Platelets 198 150 - 400 K/uL   nRBC 0.0 0.0 - 0.2 %   Neutrophils  Relative % 80 %   Neutro Abs 16.7 (H) 1.7 - 7.7 K/uL   Lymphocytes Relative 15 %   Lymphs Abs 3.0 0.7 - 4.0 K/uL   Monocytes Relative 2 %   Monocytes Absolute 0.3 0.1 - 1.0 K/uL   Eosinophils Relative 1 %   Eosinophils Absolute 0.2 0.0 - 0.5 K/uL   Basophils Relative 1 %   Basophils Absolute 0.1 0.0 - 0.1 K/uL   Immature Granulocytes 1 %   Abs Immature Granulocytes 0.29 (H) 0.00 - 0.07 K/uL    Comment: Performed at [facility], [address] [place], [place] [address]  Comprehensive metabolic panel     Status: Abnormal   Collection Time: 09/25/18  9:01 AM  Result Value Ref Range   Sodium 143 135 - 145 mmol/L   Potassium 4.0 3.5 - 5.1 mmol/L   Chloride 107 98 - 111 mmol/L   CO2 19 (L) 22 - 32 mmol/L   Glucose, Bld 241 (H) 70 - 99 mg/dL   BUN 13 6 - 20 mg/dL   Creatinine, Ser 4.691.29 (H) 0.61 - 1.24 mg/dL   Calcium 8.8 (L) 8.9 - 10.3 mg/dL   Total Protein 7.1 6.5 - 8.1 g/dL   Albumin 3.9 3.5 - 5.0 g/dL   AST 33 15 - 41 U/L   ALT 23 0 - 44 U/L   Alkaline Phosphatase 82 38 - 126 U/L   Total Bilirubin 0.5 0.3 - 1.2 mg/dL   GFR calc non Af Amer >60 >60 mL/min   GFR calc Af Amer >60 >60 mL/min   Anion gap 17 (H) 5 - 15    Comment: Performed at [facility], [address] [place], [place] [address]  Protime-INR     Status: None   Collection Time: 09/25/18  9:01 AM  Result Value Ref Range   Prothrombin Time 13.7 11.4 - 15.2 seconds   INR 1.1 0.8 - 1.2    Comment: (NOTE) INR goal varies based on device and disease states. Performed at [facility], [address] [place], [place] [address]   APTT     Status: None   Collection Time: 09/25/18  9:01 AM  Result Value Ref Range   aPTT  27 24 - 36 seconds    Comment: Performed at [facility], [address], [place], [place] [address]  Lactic acid, plasma     Status: Abnormal   Collection Time: 09/25/18  9:01 AM  Result Value Ref Range   Lactic Acid, Venous 5.8 (HH) 0.5 - 1.9 mmol/L    Comment: CRITICAL  RESULT CALLED TO, READ BACK BY AND VERIFIED WITH: MICHAELSON,B RN @ 1051 09/25/18 LEONARD,A Performed at [facility], [address] [place], [place] [address]   SARS Coronavirus 2 (CEPHEID - Performed in [facility] hospital lab), Hosp Order     Status: None   Collection Time: 09/25/18  9:03 AM   Specimen: Nasopharyngeal Swab  Result Value Ref Range   SARS Coronavirus 2 NEGATIVE NEGATIVE    Comment: (NOTE) If result is NEGATIVE SARS-CoV-2 target nucleic acids are NOT DETECTED. The SARS-CoV-2 RNA is generally detectable in upper and lower  respiratory specimens during the acute phase of infection. The lowest  concentration of SARS-CoV-2 viral copies this assay can detect is 250  copies / mL. A negative result does not preclude SARS-CoV-2 infection  and should not be used as the sole basis for treatment or other  patient management decisions.  A negative result may occur with  improper specimen collection / handling, submission of specimen other  than nasopharyngeal swab, presence of viral mutation(s) within the  areas targeted by this assay, and inadequate number of viral copies  (<250 copies / mL). A negative result must be combined with clinical  observations, patient history, and epidemiological information. If result is POSITIVE SARS-CoV-2 target nucleic acids are DETECTED. The SARS-CoV-2 RNA is generally detectable in upper and lower  respiratory specimens dur ing the acute phase of infection.  Positive  results are indicative of active infection with SARS-CoV-2.  Clinical  correlation with patient history and other diagnostic information is  necessary to determine patient infection status.  Positive results do  not rule out bacterial infection or co-infection with other viruses. If result is PRESUMPTIVE POSTIVE SARS-CoV-2 nucleic acids MAY BE PRESENT.   A presumptive positive result was obtained on the submitted specimen  and confirmed on repeat testing.  While 2019  novel coronavirus  (SARS-CoV-2) nucleic acids may be present in the submitted sample  additional confirmatory testing may be necessary for epidemiological  and / or clinical management purposes  to differentiate between  SARS-CoV-2 and other Sarbecovirus currently known to infect humans.  If clinically indicated additional testing with an alternate test  methodology [phone]) is advised. The SARS-CoV-2 RNA is generally  detectable in upper and lower respiratory sp ecimens during the acute  phase of infection. The expected result is Negative. Fact Sheet for Patients:  BoilerBrush.com.cy Fact Sheet for Healthcare Providers: https://pope.com/ This test is not yet approved or cleared by the [place] FDA and has been authorized for detection and/or diagnosis of SARS-CoV-2 by FDA under an Emergency Use Authorization (EUA).  This EUA will remain in effect (meaning this test can be used) for the duration of the COVID-19 declaration under Section 564(b)(1) of the Act, 21 U.S.C. section 360bbb-3(b)(1), unless the authorization is terminated or revoked sooner. Performed at [facility], [address], [place], [place] [address]   Triglycerides     Status: None   Collection Time: 09/25/18  9:20 AM  Result Value Ref Range   Triglycerides 92 <150 mg/dL    Comment: Performed at [facility], [address], [place], [place] [address]  Type and screen     Status: None   Collection Time: 09/25/18  9:35 AM  Result Value Ref Range   ABO/RH(D) O POS    Antibody Screen NEG    Sample Expiration      09/28/2018,2359 Performed at [facility], [address] [place], [place] [address]   ABO/Rh     Status: None (Preliminary result)   Collection Time: 09/25/18  9:35 AM  Result Value Ref Range   ABO/RH(D)      O POS Performed at [facility], [address], [place], [place] [address]   I-STAT 7, (LYTES, BLD GAS, ICA,  H+H)     Status: Abnormal   Collection Time: 09/25/18 10:56 AM  Result Value Ref Range   pH, Arterial 7.168 (LL) 7.350 - 7.450   pCO2 arterial 48.6 (H) 32.0 - 48.0 mmHg   pO2, Arterial 70.0 (L) 83.0 - 108.0 mmHg   Bicarbonate 17.7 (L) 20.0 - 28.0 mmol/L   TCO2 19 (L) 22 - 32 mmol/L   O2 Saturation 88.0 %   Acid-base deficit 11.0 (H) 0.0 - 2.0 mmol/L   Sodium 141 135 - 145 mmol/L   Potassium 4.4 3.5 - 5.1 mmol/L   Calcium, Ion 1.26 1.15 - 1.40 mmol/L   HCT 53.0 (H) 39.0 - 52.0 %   Hemoglobin 18.0 (H) 13.0 - 17.0 g/dL   Patient temperature HIDE    Sample type ARTERIAL    Comment NOTIFIED PHYSICIAN    Ct Head Wo Contrast  Result Date: 09/25/2018 CLINICAL DATA:  Altered level consciousness, respiratory failure, overdose EXAM: CT HEAD WITHOUT CONTRAST TECHNIQUE: Contiguous axial images were obtained from the base of the skull through the vertex without intravenous contrast. COMPARISON:  None. FINDINGS: Brain: No acute intracranial hemorrhage, mass lesion, new infarction, midline shift, herniation hydrocephalus, extra-axial collection. Gray-white matter differentiation maintained. No focal mass effect or edema. Cisterns are patent. No gross cerebellar abnormality. Vascular: No hyperdense vessel or unexpected calcification. Skull: Normal. Negative for fracture or focal lesion. Sinuses/Orbits: No acute finding. Other: None. IMPRESSION: No acute intracranial abnormality by noncontrast CT. Electronically Signed   By: Judie Petit.  Shick M.D.   On: 09/25/2018 10:45   Dg Chest Portable 1 View  Result Date: 09/25/2018 CLINICAL DATA:  Respiratory failure, possible overdose EXAM: PORTABLE CHEST 1 VIEW COMPARISON:  09/25/2018 FINDINGS: Endotracheal tube 4.2 cm above the carina. Severe bilateral airspace process persists. Little change in aeration. No enlarging effusion or pneumothorax. Normal heart size. IMPRESSION: Endotracheal tube 4.2 cm above the carina. Severe bilateral airspace process persists. Electronically  Signed   By: Judie Petit.  Shick M.D.   On: 09/25/2018 10:42   Dg Chest Portable 1 View  Result Date: 09/25/2018 CLINICAL DATA:  Intubation, suspected overdose EXAM: PORTABLE CHEST 1 VIEW COMPARISON:  None. FINDINGS: Endotracheal tube 2.1 cm above the carina. Severe bilateral airspace opacities with central air bronchograms compatible with pneumonia, hemorrhage, edema or aspiration. No large effusion or pneumothorax. Trachea midline. Normal heart size and vascularity. No acute osseous finding. IMPRESSION: Endotracheal tube 2.1 cm above the carina Severe bilateral airspace process. Electronically Signed   By: Judie Petit.  Shick M.D.   On: 09/25/2018 10:11    Pending Labs Unresulted Labs (From admission, onward)    Start     Ordered   09/26/18 0500  CBC  Tomorrow morning,   R     09/25/18 1146   09/26/18 0500  Basic metabolic panel  Tomorrow morning,  R     09/25/18 1146   09/26/18 0500  Blood gas, arterial  Tomorrow morning,   R     09/25/18 1146   09/26/18 0500  CBC with Differential/Platelet  Tomorrow morning,   R     09/25/18 1147   09/26/18 0500  Basic metabolic panel  Tomorrow morning,   R     09/25/18 1147   09/26/18 0500  Magnesium  Tomorrow morning,   R     09/25/18 1147   09/26/18 0500  Phosphorus  Tomorrow morning,   R     09/25/18 1147   09/25/18 1146  Amylase  Once,   STAT     09/25/18 1146   09/25/18 1146  Lactic acid, plasma  STAT Now then every 3 hours,   R (with STAT occurrences)     09/25/18 1146   09/25/18 1146  Legionella Pneumophila Serogp 1 Ur Ag  Once,   STAT     09/25/18 1146   09/25/18 1144  Comprehensive metabolic panel  Once,   STAT     09/25/18 1146   09/25/18 1144  Magnesium  Once,   STAT     09/25/18 1146   09/25/18 1144  Phosphorus  Once,   STAT     09/25/18 1146   09/25/18 1144  Procalcitonin  Once,   STAT     09/25/18 1146   09/25/18 1144  Cortisol  Once,   STAT     09/25/18 1146   09/25/18 1144  CBC  Once,   STAT     09/25/18 1146   09/25/18 1144  Protime-INR   Once,   STAT     09/25/18 1146   09/25/18 1144  APTT  Once,   STAT     09/25/18 1146   09/25/18 1144  Culture, blood (routine x 2)  BLOOD CULTURE X 2,   R (with STAT occurrences)    Question:  Patient immune status  Answer:  Normal   09/25/18 1146   09/25/18 1144  Urine culture  Once,   STAT    Question:  Patient immune status  Answer:  Normal   09/25/18 1146   09/25/18 1144  Culture, respiratory (tracheal aspirate)  Once,   STAT    Question:  Patient immune status  Answer:  Normal   09/25/18 1146   09/25/18 1144  Strep pneumoniae urinary antigen  Once,   STAT     09/25/18 1146   09/25/18 1144  Blood gas, arterial  Once,   R     09/25/18 1146   09/25/18 1142  Lactic acid, plasma  ONCE - STAT,   STAT     09/25/18 1146   09/25/18 1103  HIV antibody (Routine Testing)  Once,   STAT     09/25/18 1105   09/25/18 0920  Triglycerides  (propofol (DIPRIVAN))  Every 72 hours,   R    Comments: While on propofol (DIPRIVAN)    09/25/18 0919          Vitals/Pain Today's Vitals   09/25/18 1100 09/25/18 1101 09/25/18 1120 09/25/18 1144  BP:  (!) 110/95 112/73 104/90  Pulse: (!) 130 (!) 122  (!) 116  Resp: (!) 30 (!) 26 (!) 24 (!) 24  Temp: 97.6 F (36.4 C) 97.7 F (36.5 C) 98.1 F (36.7 C)   TempSrc:      SpO2: (!) 82% (!) 88%  96%  Weight:      Height:  Isolation Precautions No active isolations  Medications Medications  0.9 %  sodium chloride infusion ( Intravenous New Bag/Given 09/25/18 1135)  fentaNYL 2529mcg in NS 273mL (16mcg/ml) infusion-PREMIX (100 mcg/hr Intravenous New Bag/Given 09/25/18 0940)  propofol (DIPRIVAN) 1000 MG/100ML infusion (5 mcg/kg/min  100 kg Intravenous New Bag/Given 09/25/18 0925)  fentaNYL (SUBLIMAZE) injection 50 mcg (50 mcg Intravenous Not Given 09/25/18 1135)  fentaNYL 2587mcg in NS 277mL (68mcg/ml) infusion-PREMIX (100 mcg/hr Intravenous New Bag/Given 09/25/18 0935)  fentaNYL (SUBLIMAZE) bolus via infusion 50 mcg (has no administration in time  range)  propofol (DIPRIVAN) 1000 MG/100ML infusion (has no administration in time range)  enoxaparin (LOVENOX) injection 40 mg (has no administration in time range)  famotidine (PEPCID) IVPB 20 mg premix (has no administration in time range)  ondansetron (ZOFRAN) injection 4 mg (has no administration in time range)  acetaminophen (TYLENOL) tablet 650 mg (has no administration in time range)  pantoprazole (PROTONIX) injection 40 mg (has no administration in time range)  naloxone [facility]) injection 2 mg (2 mg Intravenous Given 09/25/18 0900)  etomidate (AMIDATE) injection (25 mg Intravenous Given 09/25/18 0915)  rocuronium (ZEMURON) injection (100 mg Intravenous Given 09/25/18 0915)    Mobility walks     Focused Assessments Pulmonary Assessment Handoff:  Lung sounds:   O2 Device: Ventilator        R Recommendations: See Admitting Provider Note  Report given to:   Additional Notes:

## 2018-09-26 ENCOUNTER — Inpatient Hospital Stay (HOSPITAL_COMMUNITY): Payer: Self-pay

## 2018-09-26 LAB — POCT I-STAT 7, (LYTES, BLD GAS, ICA,H+H)
Acid-base deficit: 1 mmol/L (ref 0.0–2.0)
Bicarbonate: 21.5 mmol/L (ref 20.0–28.0)
Calcium, Ion: 1.13 mmol/L — ABNORMAL LOW (ref 1.15–1.40)
HCT: 41 % (ref 39.0–52.0)
Hemoglobin: 13.9 g/dL (ref 13.0–17.0)
O2 Saturation: 99 %
Patient temperature: 37.7
Potassium: 4 mmol/L (ref 3.5–5.1)
Sodium: 140 mmol/L (ref 135–145)
TCO2: 22 mmol/L (ref 22–32)
pCO2 arterial: 30.5 mmHg — ABNORMAL LOW (ref 32.0–48.0)
pH, Arterial: 7.46 — ABNORMAL HIGH (ref 7.350–7.450)
pO2, Arterial: 125 mmHg — ABNORMAL HIGH (ref 83.0–108.0)

## 2018-09-26 LAB — BLOOD GAS, ARTERIAL
Acid-base deficit: 5 mmol/L — ABNORMAL HIGH (ref 0.0–2.0)
Bicarbonate: 18.8 mmol/L — ABNORMAL LOW (ref 20.0–28.0)
Drawn by: 51155
FIO2: 0.7
MECHVT: 600 mL
O2 Saturation: 98.8 %
Patient temperature: 98.6
RATE: 24 resp/min
pCO2 arterial: 30.4 mmHg — ABNORMAL LOW (ref 32.0–48.0)
pH, Arterial: 7.409 (ref 7.350–7.450)
pO2, Arterial: 149 mmHg — ABNORMAL HIGH (ref 83.0–108.0)

## 2018-09-26 LAB — CBC WITH DIFFERENTIAL/PLATELET
Abs Immature Granulocytes: 0.12 10*3/uL — ABNORMAL HIGH (ref 0.00–0.07)
Basophils Absolute: 0.1 10*3/uL (ref 0.0–0.1)
Basophils Relative: 1 %
Eosinophils Absolute: 0 10*3/uL (ref 0.0–0.5)
Eosinophils Relative: 0 %
HCT: 45 % (ref 39.0–52.0)
Hemoglobin: 14.7 g/dL (ref 13.0–17.0)
Immature Granulocytes: 1 %
Lymphocytes Relative: 12 %
Lymphs Abs: 1.8 10*3/uL (ref 0.7–4.0)
MCH: 29.2 pg (ref 26.0–34.0)
MCHC: 32.7 g/dL (ref 30.0–36.0)
MCV: 89.5 fL (ref 80.0–100.0)
Monocytes Absolute: 0.3 10*3/uL (ref 0.1–1.0)
Monocytes Relative: 2 %
Neutro Abs: 12.5 10*3/uL — ABNORMAL HIGH (ref 1.7–7.7)
Neutrophils Relative %: 84 %
Platelets: 161 10*3/uL (ref 150–400)
RBC: 5.03 MIL/uL (ref 4.22–5.81)
RDW: 14.7 % (ref 11.5–15.5)
WBC Morphology: INCREASED
WBC: 14.9 10*3/uL — ABNORMAL HIGH (ref 4.0–10.5)
nRBC: 0 % (ref 0.0–0.2)

## 2018-09-26 LAB — BASIC METABOLIC PANEL
Anion gap: 13 (ref 5–15)
BUN: 20 mg/dL (ref 6–20)
CO2: 19 mmol/L — ABNORMAL LOW (ref 22–32)
Calcium: 7.8 mg/dL — ABNORMAL LOW (ref 8.9–10.3)
Chloride: 110 mmol/L (ref 98–111)
Creatinine, Ser: 1.57 mg/dL — ABNORMAL HIGH (ref 0.61–1.24)
GFR calc Af Amer: >60 mL/min (ref >60)
GFR calc non Af Amer: 58 mL/min — ABNORMAL LOW (ref >60)
Glucose, Bld: 96 mg/dL (ref 70–99)
Potassium: 4.9 mmol/L (ref 3.5–5.1)
Sodium: 142 mmol/L (ref 135–145)

## 2018-09-26 LAB — PHOSPHORUS
Phosphorus: 3.5 mg/dL (ref 2.5–4.6)
Phosphorus: 3.2 mg/dL (ref 2.5–4.6)

## 2018-09-26 LAB — URINE CULTURE: Culture: NO GROWTH

## 2018-09-26 LAB — GLUCOSE, CAPILLARY
Glucose-Capillary: 89 mg/dL (ref 70–99)
Glucose-Capillary: 88 mg/dL (ref 70–99)
Glucose-Capillary: 115 mg/dL — ABNORMAL HIGH (ref 70–99)
Glucose-Capillary: 73 mg/dL (ref 70–99)
Glucose-Capillary: 86 mg/dL (ref 70–99)

## 2018-09-26 LAB — MAGNESIUM
Magnesium: 1.3 mg/dL — ABNORMAL LOW (ref 1.7–2.4)
Magnesium: 1.2 mg/dL — ABNORMAL LOW (ref 1.7–2.4)

## 2018-09-26 LAB — HIV ANTIBODY (ROUTINE TESTING W REFLEX): HIV Screen 4th Generation wRfx: NONREACTIVE

## 2018-09-26 LAB — STREP PNEUMONIAE URINARY ANTIGEN: Strep Pneumo Urinary Antigen: NEGATIVE

## 2018-09-26 LAB — CK: Total CK: 258 U/L (ref 49–397)

## 2018-09-26 MED ORDER — FOLIC ACID 1 MG PO TABS
1.0000 mg | ORAL_TABLET | Freq: Every day | ORAL | Status: DC
  Administered 2018-09-27 – 2018-09-29 (×3): 1 mg via ORAL
  Filled 2018-09-26 (×3): qty 1

## 2018-09-26 MED ORDER — ACETAMINOPHEN 325 MG PO TABS
650.0000 mg | ORAL_TABLET | ORAL | Status: DC | PRN
  Administered 2018-09-26 – 2018-09-28 (×8): 650 mg via ORAL
  Filled 2018-09-26 (×8): qty 2

## 2018-09-26 MED ORDER — VITAMIN B-1 100 MG PO TABS
100.0000 mg | ORAL_TABLET | Freq: Every day | ORAL | Status: DC
  Administered 2018-09-27 – 2018-09-29 (×3): 100 mg via ORAL
  Filled 2018-09-26 (×3): qty 1

## 2018-09-26 MED ORDER — PRO-STAT SUGAR FREE PO LIQD: 30.0000 mL | Freq: Two times a day (BID) | ORAL | Status: DC

## 2018-09-26 MED ORDER — VITAL HIGH PROTEIN PO LIQD
1000.0000 mL | ORAL | Status: DC
  Administered 2018-09-26: 09:00:00 1000 mL

## 2018-09-26 MED ORDER — THIAMINE HCL 100 MG/ML IJ SOLN
100.0000 mg | Freq: Every day | INTRAMUSCULAR | Status: DC
  Administered 2018-09-26: 11:00:00 100 mg via INTRAVENOUS
  Filled 2018-09-26: qty 2

## 2018-09-26 MED ORDER — HEPARIN SODIUM (PORCINE) 5000 UNIT/ML IJ SOLN
5000.0000 [IU] | Freq: Three times a day (TID) | INTRAMUSCULAR | Status: DC
  Administered 2018-09-26 – 2018-09-29 (×10): 5000 [IU] via SUBCUTANEOUS
  Filled 2018-09-26 (×10): qty 1

## 2018-09-26 MED ORDER — FAMOTIDINE 20 MG PO TABS
20.0000 mg | ORAL_TABLET | Freq: Two times a day (BID) | ORAL | Status: DC
  Administered 2018-09-26 – 2018-09-29 (×6): 20 mg via ORAL
  Filled 2018-09-26 (×6): qty 1

## 2018-09-26 MED ORDER — FREE WATER
100.0000 mL | Freq: Four times a day (QID) | Status: DC
  Administered 2018-09-26: 100 mL

## 2018-09-26 MED ORDER — FOLIC ACID 5 MG/ML IJ SOLN
1.0000 mg | Freq: Every day | INTRAMUSCULAR | Status: DC
  Administered 2018-09-26: 11:00:00 1 mg via INTRAVENOUS
  Filled 2018-09-26: qty 0.2

## 2018-09-26 MED ORDER — MAGNESIUM SULFATE 2 GM/50ML IV SOLN
2.0000 g | Freq: Once | INTRAVENOUS | Status: AC
  Administered 2018-09-26: 2 g via INTRAVENOUS
  Filled 2018-09-26: qty 50

## 2018-09-26 MED ORDER — FOLIC ACID 1 MG PO TABS: 1.0000 mg | ORAL_TABLET | Freq: Every day | ORAL | Status: DC

## 2018-09-26 NOTE — Progress Notes (Signed)
eLink Physician-Brief Progress Note Patient Name: Richard Rangel DOB: 1985/10/28 MRN: [id number]   Date of Service  09/26/2018  HPI/Events of Note  A 33 year old African-American male was admitted with cocaine abuse, extubated today.  eICU Interventions  Has sinus tachycardia with tachypnea and respiratory rate in 20s probably secondary to fever and anxiety.  On PRN Tylenol.  QTC 459.  Ordered PRN Haldol.      Intervention Category Intermediate Interventions: Communication with other healthcare providers and/or family;Medication change / dose adjustment  Mady Gemma 09/26/2018, 8:22 PM

## 2018-09-26 NOTE — Plan of Care (Signed)
Patient's home meds were taken to Inpatient Pharmacy for safe storage.
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
## 2018-09-26 NOTE — Progress Notes (Signed)
Nutrition Brief Note  RD consulted for Tube feeding initiation. Protocol was initiated  Pt was successfully extubated shortly after consult was placed. Pt no longer has OGT or feeding access. Will d/c consult and TF protocol at this time.   If fails swallow eval, may be appropriate for Cortrak placement Monday.   No nutrition interventions warranted at this time. If nutrition issues arise, please consult RD.   Burtis Junes RD, LDN, CNSC Clinical Nutrition Available Tues-Sat via Pager: [phone] 09/26/2018 10:06 AM

## 2018-09-26 NOTE — Procedures (Signed)
Extubation Procedure Note  Patient Details:   Name: Richard Rangel DOB: 01-23-1986 MRN: [id number]   Airway Documentation:    Vent end date: 09/26/18 Vent end time: 0950   Evaluation  O2 sats: stable throughout Complications: No apparent complications Patient did tolerate procedure well. Bilateral Breath Sounds: Clear, Diminished   Yes: patient was able to breathe around the ETT prior to extubation and speak post extubation. The patient tolerated well with no stridor noted at this time. The patient was placed on a 3lpm N/C with o2 saturation at 97%. Will continue to monitor.   Acquanetta Belling 09/26/2018, 9:51 AM
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
## 2018-09-27 ENCOUNTER — Other Ambulatory Visit: Payer: Self-pay

## 2018-09-27 ENCOUNTER — Inpatient Hospital Stay (HOSPITAL_COMMUNITY): Payer: Self-pay

## 2018-09-27 DIAGNOSIS — J80 Acute respiratory distress syndrome: Secondary | ICD-10-CM

## 2018-09-27 LAB — CBC
HCT: 36.4 % — ABNORMAL LOW (ref 39.0–52.0)
Hemoglobin: 12.2 g/dL — ABNORMAL LOW (ref 13.0–17.0)
MCH: 29.9 pg (ref 26.0–34.0)
MCHC: 33.5 g/dL (ref 30.0–36.0)
MCV: 89.2 fL (ref 80.0–100.0)
Platelets: 129 10*3/uL — ABNORMAL LOW (ref 150–400)
RBC: 4.08 MIL/uL — ABNORMAL LOW (ref 4.22–5.81)
RDW: 14.2 % (ref 11.5–15.5)
WBC: 18.3 10*3/uL — ABNORMAL HIGH (ref 4.0–10.5)
nRBC: 0 % (ref 0.0–0.2)

## 2018-09-27 LAB — LACTIC ACID, PLASMA: Lactic Acid, Venous: 1.1 mmol/L (ref 0.5–1.9)

## 2018-09-27 LAB — COMPREHENSIVE METABOLIC PANEL
ALT: 15 U/L (ref 0–44)
AST: 26 U/L (ref 15–41)
Albumin: 2.6 g/dL — ABNORMAL LOW (ref 3.5–5.0)
Alkaline Phosphatase: 65 U/L (ref 38–126)
Anion gap: 7 (ref 5–15)
BUN: 14 mg/dL (ref 6–20)
CO2: 24 mmol/L (ref 22–32)
Calcium: 8.3 mg/dL — ABNORMAL LOW (ref 8.9–10.3)
Chloride: 108 mmol/L (ref 98–111)
Creatinine, Ser: 1.24 mg/dL (ref 0.61–1.24)
GFR calc Af Amer: >60 mL/min (ref >60)
GFR calc non Af Amer: >60 mL/min (ref >60)
Glucose, Bld: 91 mg/dL (ref 70–99)
Potassium: 3.8 mmol/L (ref 3.5–5.1)
Sodium: 139 mmol/L (ref 135–145)
Total Bilirubin: 1 mg/dL (ref 0.3–1.2)
Total Protein: 5.7 g/dL — ABNORMAL LOW (ref 6.5–8.1)

## 2018-09-27 LAB — POCT I-STAT 7, (LYTES, BLD GAS, ICA,H+H)
Bicarbonate: 24.3 mmol/L (ref 20.0–28.0)
Calcium, Ion: 1.17 mmol/L (ref 1.15–1.40)
HCT: 38 % — ABNORMAL LOW (ref 39.0–52.0)
Hemoglobin: 12.9 g/dL — ABNORMAL LOW (ref 13.0–17.0)
O2 Saturation: 83 %
Patient temperature: 99.4
Potassium: 3.6 mmol/L (ref 3.5–5.1)
Sodium: 139 mmol/L (ref 135–145)
TCO2: 25 mmol/L (ref 22–32)
pCO2 arterial: 37.5 mmHg (ref 32.0–48.0)
pH, Arterial: 7.422 (ref 7.350–7.450)
pO2, Arterial: 47 mmHg — ABNORMAL LOW (ref 83.0–108.0)

## 2018-09-27 MED ORDER — ZOLPIDEM TARTRATE 5 MG PO TABS
5.0000 mg | ORAL_TABLET | Freq: Once | ORAL | Status: AC
  Administered 2018-09-27: 21:00:00 5 mg via ORAL
  Filled 2018-09-27: qty 1

## 2018-09-27 MED ORDER — IPRATROPIUM-ALBUTEROL 0.5-2.5 (3) MG/3ML IN SOLN
3.0000 mL | Freq: Three times a day (TID) | RESPIRATORY_TRACT | Status: DC
  Administered 2018-09-28 – 2018-09-29 (×4): 3 mL via RESPIRATORY_TRACT
  Filled 2018-09-27 (×4): qty 3

## 2018-09-27 MED ORDER — IBUPROFEN 200 MG PO TABS: 400.0000 mg | ORAL_TABLET | ORAL | Status: DC | PRN

## 2018-09-27 MED ORDER — IPRATROPIUM-ALBUTEROL 0.5-2.5 (3) MG/3ML IN SOLN
3.0000 mL | Freq: Four times a day (QID) | RESPIRATORY_TRACT | Status: DC
  Administered 2018-09-27 (×2): 3 mL via RESPIRATORY_TRACT
  Filled 2018-09-27 (×2): qty 3

## 2018-09-27 NOTE — Progress Notes (Signed)
PROGRESS NOTE    Richard Rangel  ZOX:[id number] DOB: 11/30/1985 DOA: 09/25/2018 PCP: Patient, No Pcp Per   Brief Narrative:  Patient is a [age] year old male with history of hypertension who was brought to the emergency department after he was found to be semiconscious in a hotel room.  He was obtunded on arrival to the emergency department and required intubation.  UDS was positive for cocaine, cannabis.  Ethanol level is more than 100.  Chest x-ray suggestive of ARDS.  Also found to have acute kidney injury.  [facility] This morning he was still found to have mild grade fever.  Currently on nasal cannula.  Assessment & Plan:   Active Problems:   ARDS (adult respiratory distress syndrome) (HCC)   Accidental drug overdose   ARDS/acute hypoxic respiratory failure: Thought to be secondary to aspiration event in the setting of overdose.  Had to be intubated on admission.  Finally extubated.   Chest Xray this morning shows persistent bilateral pulmonary infiltrates, worsened in the left upper lobe. Continue aspiration precaution.    Aspiration pneumonia: Continue current antibiotics.  Currently on Unasyn.  Still has mild grade fever.  Will send blood cultures.  Continue supplemental oxygen as needed.  Will request for chest physiotherapy.  AKI: Resolved.  Hypomagnesemia: Supplemented and corrected.  Overdose/substance abuse: UDS positive for cocaine and cannabis.  Counseled for cessation.  History of hypertension: Not on medicines at home.  Currently blood pressure stable.    Nutrition Problem: Inadequate oral intake Etiology: inability to eat      DVT prophylaxis: Heparin [place] Code Status: Full Family Communication: None present at the bedside Disposition Plan: Likely home in 2 to 3 days   Consultants: PCCM  Procedures: Intubation/extubation  Antimicrobials:  Anti-infectives (From admission, onward)   Start      Dose/Rate Route Frequency Ordered Stop   09/25/18 1300  Ampicillin-Sulbactam (UNASYN) 3 g in sodium chloride 0.9 % 100 mL IVPB     3 g 200 mL/hr over 30 Minutes Intravenous Every 6 hours 09/25/18 1214        Subjective:  Patient seen and examined the bedside this morning.  He was still drowsy/sleepy during my evaluation.  Currently on supplemental oxygen.  Found to be weak and febrile.   Objective: Vitals:   09/27/18 0500 09/27/18 0600 09/27/18 0830 09/27/18 0900  BP: 130/79 (!) 161/104  (!) 131/96  Pulse: (!) 104 (!) 104  97  Resp:  15    Temp:   99.8 F (37.7 C)   TempSrc:   Oral   SpO2: 100% 100%  98%  Weight: 82.8 kg     Height:        Intake/Output Summary (Last 24 hours) at 09/27/2018 1148 Last data filed at 09/27/2018 0700 Gross per 24 hour  Intake 1363.38 ml  Output 2000 ml  Net -636.62 ml   Filed Weights   09/25/18 0900 09/26/18 0500 09/27/18 0500  Weight: 100 kg 81.6 kg 82.8 kg    Examination:  General exam: Not in distress,average built, generalized weakness HEENT:PERRL,Oral mucosa moist, Ear/Nose normal on gross exam Respiratory system: Bilateral decreased air entry, coarse breathing sounds  cardiovascular system: S1 & S2 heard, RRR. No JVD, murmurs, rubs, gallops or clicks. No pedal edema. Gastrointestinal system: Abdomen is nondistended, soft and nontender. No organomegaly or masses felt. Normal bowel sounds heard. Central nervous system: Alert and oriented. No focal neurological deficits. Extremities: No edema, no clubbing ,  no cyanosis, distal peripheral pulses palpable. Skin: No rashes, lesions or ulcers,no icterus ,no pallor      Data Reviewed: I have personally reviewed following labs and imaging studies  CBC: Recent Labs  Lab 09/25/18 0901  09/25/18 1225 09/25/18 1256 09/26/18 0339 09/26/18 0840 09/27/18 0128 09/27/18 0406  WBC 20.7*  --  5.5  --  14.9*  --   --  18.3*  NEUTROABS 16.7*  --   --   --  12.5*  --   --   --   HGB 15.6   < >  16.9 18.0* 14.7 13.9 12.9* 12.2*  HCT 50.4   < > 53.2* 53.0* 45.0 41.0 38.0* 36.4*  MCV 93.7  --  91.4  --  89.5  --   --  89.2  PLT 198  --  199  --  161  --   --  129*   < > = values in this interval not displayed.   Basic Metabolic Panel: Recent Labs  Lab 09/25/18 0901  09/25/18 1225 09/25/18 1256 09/26/18 0339 09/26/18 0830 09/26/18 0840 09/27/18 0128 09/27/18 0406  NA 143   < > 142 140 142  --  140 139 139  K 4.0   < > 4.8 4.6 4.9  --  4.0 3.6 3.8  CL 107  --  110  --  110  --   --   --  108  CO2 19*  --  23  --  19*  --   --   --  24  GLUCOSE 241*  --  101*  --  96  --   --   --  91  BUN 13  --  13  --  20  --   --   --  14  CREATININE 1.29*  --  1.31*  --  1.57*  --   --   --  1.24  CALCIUM 8.8*  --  8.7*  --  7.8*  --   --   --  8.3*  MG  --   --  1.6*  --  1.3* 1.2*  --   --   --   PHOS  --   --  3.5  --  3.5 3.2  --   --   --    < > = values in this interval not displayed.   GFR: Estimated Creatinine Clearance: 91.9 mL/min (by C-G formula based on SCr of 1.24 mg/dL). Liver Function Tests: Recent Labs  Lab 09/25/18 0901 09/25/18 1225 09/27/18 0406  AST 33 33 26  ALT ALKPHOS 82 73 65  BILITOT 0.5 0.6 1.0  PROT 7.1 6.6 5.7*  ALBUMIN 3.9 3.6 2.6*   Recent Labs  Lab 09/25/18 1225  AMYLASE 116*   No results for input(s): AMMONIA in the last 168 hours. Coagulation Profile: Recent Labs  Lab 09/25/18 0901 09/25/18 1225  INR 1.1 1.1   Cardiac Enzymes: Recent Labs  Lab 09/26/18 0830  CKTOTAL 258   BNP (last 3 results) No results for input(s): PROBNP in the last 8760 hours. HbA1C: No results for input(s): HGBA1C in the last 72 hours. CBG: Recent Labs  Lab 09/26/18 0346 09/26/18 0754 09/26/18 1222 09/26/18 1612 09/26/18 2026  GLUCAP 89 88 115* 73 86   Lipid Profile: Recent Labs    09/25/18 0920  TRIG 92   Thyroid Function Tests: No results for input(s): TSH, T4TOTAL, FREET4, T3FREE, THYROIDAB in the last 72 hours. Anemia  Panel: No results for input(s): VITAMINB12, FOLATE, FERRITIN, TIBC, IRON, RETICCTPCT in the last 72 hours. Sepsis Labs: Recent Labs  Lab 09/25/18 0901 09/25/18 1225 09/25/18 1405 09/27/18 0118  PROCALCITON  --  1.43  --   --   LATICACIDVEN 5.8* 3.6* 2.8* 1.1    Recent Results (from the past 240 hour(s))  SARS Coronavirus 2 (CEPHEID - Performed in [facility] lab), Hosp Order     Status: None   Collection Time: 09/25/18  9:03 AM   Specimen: Nasopharyngeal Swab  Result Value Ref Range Status   SARS Coronavirus 2 NEGATIVE NEGATIVE Final    Comment: (NOTE) If result is NEGATIVE SARS-CoV-2 target nucleic acids are NOT DETECTED. The SARS-CoV-2 RNA is generally detectable in upper and lower  respiratory specimens during the acute phase of infection. The lowest  concentration of SARS-CoV-2 viral copies this assay can detect is 250  copies / mL. A negative result does not preclude SARS-CoV-2 infection  and should not be used as the sole basis for treatment or other  patient management decisions.  A negative result may occur with  improper specimen collection / handling, submission of specimen other  than nasopharyngeal swab, presence of viral mutation(s) within the  areas targeted by this assay, and inadequate number of viral copies  (<250 copies / mL). A negative result must be combined with clinical  observations, patient history, and epidemiological information. If result is POSITIVE SARS-CoV-2 target nucleic acids are DETECTED. The SARS-CoV-2 RNA is generally detectable in upper and lower  respiratory specimens dur ing the acute phase of infection.  Positive  results are indicative of active infection with SARS-CoV-2.  Clinical  correlation with patient history and other diagnostic information is  necessary to determine patient infection status.  Positive results do  not rule out bacterial infection or co-infection with other viruses. If result is PRESUMPTIVE  POSTIVE SARS-CoV-2 nucleic acids MAY BE PRESENT.   A presumptive positive result was obtained on the submitted specimen  and confirmed on repeat testing.  While 2019 novel coronavirus  (SARS-CoV-2) nucleic acids may be present in the submitted sample  additional confirmatory testing may be necessary for epidemiological  and / or clinical management purposes  to differentiate between  SARS-CoV-2 and other Sarbecovirus currently known to infect humans.  If clinically indicated additional testing with an alternate test  methodology [phone]([id number]) is advised. The SARS-CoV-2 RNA is generally  detectable in upper and lower respiratory sp ecimens during the acute  phase of infection. The expected result is Negative. Fact Sheet for Patients:  BoilerBrush.com.cyhttps://www.fda.gov/media/136312/download Fact Sheet for Healthcare Providers: https://pope.com/https://www.fda.gov/media/136313/download This test is not yet approved or cleared by the [place] FDA and has been authorized for detection and/or diagnosis of SARS-CoV-2 by FDA under an Emergency Use Authorization (EUA).  This EUA will remain in effect (meaning this test can be used) for the duration of the COVID-19 declaration under Section 564(b)(1) of the Act, 21 U.S.C. section 360bbb-3(b)(1), unless the authorization is terminated or revoked sooner. Performed at [facility], [address] [place], [place] [address]   Urine culture     Status: None   Collection Time: 09/25/18  9:50 AM   Specimen: Urine, Random  Result Value Ref Range Status   Specimen Description URINE, RANDOM  Final   Special Requests NONE  Final   Culture   Final    NO GROWTH Performed at [facility], [address] [place], [place] [address]  Report Status 09/26/2018 FINAL  Final  MRSA PCR Screening     Status: None   Collection Time: 09/25/18  1:31 PM   Specimen: Nasopharyngeal  Result Value Ref Range Status   MRSA by PCR NEGATIVE NEGATIVE Final    Comment:        The  GeneXpert MRSA Assay (FDA approved for NASAL specimens only), is one component of a comprehensive MRSA colonization surveillance program. It is not intended to diagnose MRSA infection nor to guide or monitor treatment for MRSA infections. Performed at [facility], [address] [place], [place] [address]   Culture, blood (routine x 2)     Status: None (Preliminary result)   Collection Time: 09/25/18  2:15 PM   Specimen: BLOOD  Result Value Ref Range Status   Specimen Description BLOOD LEFT ANTECUBITAL  Final   Special Requests   Final    BOTTLES DRAWN AEROBIC AND ANAEROBIC Blood Culture adequate volume   Culture   Final    NO GROWTH 1 DAY Performed at [facility], [address] [place], [place] [address]    Report Status PENDING  Incomplete  Culture, blood (routine x 2)     Status: None (Preliminary result)   Collection Time: 09/25/18  2:15 PM   Specimen: BLOOD  Result Value Ref Range Status   Specimen Description BLOOD RIGHT ANTECUBITAL  Final   Special Requests   Final    BOTTLES DRAWN AEROBIC AND ANAEROBIC Blood Culture adequate volume   Culture   Final    NO GROWTH 1 DAY Performed at [facility], [address] [place], [place] [address]    Report Status PENDING  Incomplete         Radiology Studies: Dg Chest Port 1 View  Result Date: 09/27/2018 CLINICAL DATA:  ARDS. EXAM: PORTABLE CHEST 1 VIEW COMPARISON:  Chest x-ray from yesterday. FINDINGS: Interval removal of the endotracheal and enteric tubes. Unchanged right internal jugular central venous catheter. The heart size and mediastinal contours are within normal limits. Bilateral pulmonary infiltrates again noted, worsened in the left upper lobe. No pleural effusion or pneumothorax. No acute osseous abnormality. IMPRESSION: 1. Persistent bilateral pulmonary infiltrates, worsened in the left upper lobe. Electronically Signed   By: Obie DredgeWilliam T Derry M.D.   On: 09/27/2018 08:23   Dg Chest Port 1  View  Result Date: 09/26/2018 CLINICAL DATA:  ARDS. EXAM: PORTABLE CHEST 1 VIEW COMPARISON:  Chest x-ray from yesterday. FINDINGS: Unchanged endotracheal and enteric tubes. Unchanged right internal jugular central venous catheter. The heart size and mediastinal contours are within normal limits. Bilateral pulmonary infiltrates again noted, mildly improved. No pleural effusion or pneumothorax. No acute osseous abnormality. IMPRESSION: 1. Improving bilateral pulmonary infiltrates. Electronically Signed   By: Obie DredgeWilliam T Derry M.D.   On: 09/26/2018 08:43   Dg Chest Port 1 View  Result Date: 09/25/2018 CLINICAL DATA:  Verify placement of central venous catheter. EXAM: PORTABLE CHEST 1 VIEW COMPARISON:  September 25, 2018 FINDINGS: The ETT is in good position. The NG tube terminates below today's film. A new right IJ terminates in the central SVC. No pneumothorax. The heart, hila, mediastinum are normal. No pulmonary nodules or masses. Bilateral pulmonary infiltrates remain. IMPRESSION: 1. Support apparatus as above. No pneumothorax after right central line placement. 2. Bilateral pulmonary infiltrates remain, mildly improved in the interval. Electronically Signed   By: Gerome Samavid  Williams III M.D   On: 09/25/2018 17:13  Scheduled Meds:  Chlorhexidine Gluconate Cloth  6 each Topical Daily   famotidine  20 mg Oral BID   folic acid  1 mg Oral Daily   heparin injection (subcutaneous)  5,000 Units Subcutaneous Q8H   thiamine  100 mg Oral Daily   Continuous Infusions:  sodium chloride 10 mL/hr at 09/25/18 2000   ampicillin-sulbactam (UNASYN) IV 3 g (09/27/18 0646)     LOS: 2 days    Time spent: 35 mins. More than 50% of that time was spent in counseling and/or coordination of care.      Shelly Coss, MD [facility] Pager [phone]  If 7PM-7AM, please contact night-coverage [email or web address] Password [email or web address] 09/27/2018, 11:48 AM

## 2018-09-27 NOTE — Progress Notes (Signed)
ABG drawn, result given to RN.  Pa02 is low. Sample was bright red, no indicative of venous sample.  Pt on 3L [place] currently, increased to 6L [place]. Pt tolerating well. Will continue to monitor.

## 2018-09-27 NOTE — Plan of Care (Signed)
  Problem: Education: Goal: Knowledge of General Education information will improve Description: Including pain rating scale, medication(s)/side effects and non-pharmacologic comfort measures 09/27/2018 2339 by Dulcy Fanny, RN Outcome: Progressing 09/27/2018 2339 by Dulcy Fanny, RN Outcome: Progressing   Problem: Clinical Measurements: Goal: Will remain free from infection 09/27/2018 2339 by Dulcy Fanny, RN Outcome: Progressing 09/27/2018 2339 by Dulcy Fanny, RN Outcome: Progressing   Problem: Clinical Measurements: Goal: Respiratory complications will improve 09/27/2018 2339 by Dulcy Fanny, RN Outcome: Progressing 09/27/2018 2339 by Dulcy Fanny, RN Outcome: Progressing

## 2018-09-28 ENCOUNTER — Encounter (HOSPITAL_COMMUNITY): Payer: Self-pay | Admitting: *Deleted

## 2018-09-28 LAB — CBC WITH DIFFERENTIAL/PLATELET
Abs Immature Granulocytes: 0.32 10*3/uL — ABNORMAL HIGH (ref 0.00–0.07)
Basophils Absolute: 0 10*3/uL (ref 0.0–0.1)
Basophils Relative: 0 %
Eosinophils Absolute: 0.3 10*3/uL (ref 0.0–0.5)
Eosinophils Relative: 2 %
HCT: 36.1 % — ABNORMAL LOW (ref 39.0–52.0)
Hemoglobin: 12.1 g/dL — ABNORMAL LOW (ref 13.0–17.0)
Immature Granulocytes: 2 %
Lymphocytes Relative: 9 %
Lymphs Abs: 1.3 10*3/uL (ref 0.7–4.0)
MCH: 29.3 pg (ref 26.0–34.0)
MCHC: 33.5 g/dL (ref 30.0–36.0)
MCV: 87.4 fL (ref 80.0–100.0)
Monocytes Absolute: 0.3 10*3/uL (ref 0.1–1.0)
Monocytes Relative: 2 %
Neutro Abs: 12.6 10*3/uL — ABNORMAL HIGH (ref 1.7–7.7)
Neutrophils Relative %: 85 %
Platelets: 136 10*3/uL — ABNORMAL LOW (ref 150–400)
RBC: 4.13 MIL/uL — ABNORMAL LOW (ref 4.22–5.81)
RDW: 13.5 % (ref 11.5–15.5)
WBC: 14.9 10*3/uL — ABNORMAL HIGH (ref 4.0–10.5)
nRBC: 0 % (ref 0.0–0.2)

## 2018-09-28 LAB — TRIGLYCERIDES: Triglycerides: 207 mg/dL — ABNORMAL HIGH (ref <150)

## 2018-09-28 MED ORDER — ZOLPIDEM TARTRATE 5 MG PO TABS
5.0000 mg | ORAL_TABLET | Freq: Once | ORAL | Status: AC
  Administered 2018-09-28: 21:00:00 5 mg via ORAL
  Filled 2018-09-28: qty 1

## 2018-09-28 NOTE — Evaluation (Signed)
Physical Therapy Evaluation Patient Details Name: Richard MassJamaal Flanery MRN: [id number] DOB: [birth year] Today's Date: 09/28/2018   History of Present Illness  33 year old man presenting after being found down in setting of polysubstance abuse.  CXR c/w aspiration and ARDS. He also has significant kidney injury. Suspected drug overdose. Intubated 7/3-7/4  Clinical Impression  PTA pt independent with mobility and ADLs, living with girlfriend in single story apartment with level entry. Pt is currently limited by decreased balance and endurance. Pt is mod I for bed mobility, and transfers and min guard for ambulation without AD. Pt will not require any PT services at discharge, however PT will continue to work on progressing stability with gait.     Follow Up Recommendations No PT follow up;Supervision for mobility/OOB    Equipment Recommendations  None recommended by PT       Precautions / Restrictions Precautions Precautions: None Restrictions Weight Bearing Restrictions: No      Mobility  Bed Mobility Overal bed mobility: Modified Independent             General bed mobility comments: use of bedrail to scoot to EoB  Transfers Overall transfer level: Modified independent Equipment used: None             General transfer comment: good power up and steadying, no dizziness light headedness  Ambulation/Gait Ambulation/Gait assistance: Min guard Gait Distance (Feet): 450 Feet Assistive device: None Gait Pattern/deviations: Drifts right/left Gait velocity: slowed Gait velocity interpretation: >2.62 ft/sec, indicative of community ambulatory General Gait Details: min guard for safety, mild instability throughout ambulation, which increased with fatigue, no over LoB         Balance Overall balance assessment: Needs assistance Sitting-balance support: No upper extremity supported;Feet supported Sitting balance-Leahy Scale: Normal     Standing balance support: No upper  extremity supported;During functional activity Standing balance-Leahy Scale: Good                               Pertinent Vitals/Pain Pain Assessment: No/denies pain    Home Living Family/patient expects to be discharged to:: Private residence Living Arrangements: Spouse/significant other Available Help at Discharge: Friend(s);Available 24 hours/day Type of Home: Apartment Home Access: Level entry     Home Layout: One level Home Equipment: None      Prior Function Level of Independence: Independent                  Extremity/Trunk Assessment   Upper Extremity Assessment Upper Extremity Assessment: Overall WFL for tasks assessed    Lower Extremity Assessment Lower Extremity Assessment: Overall WFL for tasks assessed    Cervical / Trunk Assessment Cervical / Trunk Assessment: Normal  Communication   Communication: No difficulties  Cognition Arousal/Alertness: Awake/alert Behavior During Therapy: WFL for tasks assessed/performed Overall Cognitive Status: Within Functional Limits for tasks assessed                                        General Comments General comments (skin integrity, edema, etc.): SaO2 on RA >90%O2, VSS    Exercises     Assessment/Plan    PT Assessment Patient needs continued PT services  PT Problem List Decreased balance       PT Treatment Interventions Gait training;Balance training;Therapeutic activities;Therapeutic exercise;Patient/family education    PT Goals (Current goals can be found in the  Care Plan section)  Acute Rehab PT Goals Patient Stated Goal: take a shower PT Goal Formulation: With patient Time For Goal Achievement: 10/12/18 Potential to Achieve Goals: Good    Frequency Min 3X/week    AM-PAC PT "6 Clicks" Mobility  Outcome Measure Help needed turning from your back to your side while in a flat bed without using bedrails?: None Help needed moving from lying on your back to sitting  on the side of a flat bed without using bedrails?: None Help needed moving to and from a bed to a chair (including a wheelchair)?: None Help needed standing up from a chair using your arms (e.g., wheelchair or bedside chair)?: None Help needed to walk in hospital room?: None Help needed climbing 3-5 steps with a railing? : A Little 6 Click Score: 23    End of Session Equipment Utilized During Treatment: Gait belt Activity Tolerance: Patient tolerated treatment well Patient left: in chair;with call bell/phone within reach Nurse Communication: Mobility status PT Visit Diagnosis: Unsteadiness on feet (R26.81)    Time: 6644-0347 PT Time Calculation (min) (ACUTE ONLY): 35 min   Charges:   PT Evaluation $PT Eval Moderate Complexity: 1 Mod PT Treatments $Gait Training: 8-22 mins        Jabron Weese B. Migdalia Dk PT, DPT Acute Rehabilitation Services Pager [phone] Office [phone]   [place] 09/28/2018, 10:52 AM

## 2018-09-28 NOTE — Progress Notes (Signed)
Pharmacy Antibiotic Note  Richard Rangel is a 33 y.o. male admitted on 09/25/2018 with CAP/aspiration.  Pharmacy has been consulted for Unasyn dosing.  Day # 4 of Unasyn: -CXR on 7/5 with persistent bilateral infiltrates -Persistent leukocytosis but trending down overall.  -SCr is improving at 1.24 with CrCl >90 mL/min -Fever curve is down and patient is currently afebrile today -Cultures negative to date - repeat BCx sent yesterday  Plan: Continue Unasyn 3g IV every 6 hours Monitor clinical status and renal function *Recommend stop date of 7/9 for total of 7 days of therapy  Height: 5\' 11"  (180.3 cm) Weight: 182 lb 8.7 oz (82.8 kg) IBW/kg (Calculated) : 75.3  Temp (24hrs), Avg:99.4 F (37.4 C), Min:98.7 F (37.1 C), Max:99.9 F (37.7 C)  Recent Labs  Lab 09/25/18 0901 09/25/18 1225 09/25/18 1405 09/26/18 0339 09/27/18 0118 09/27/18 0406 09/28/18 0447  WBC 20.7* 5.5  --  14.9*  --  18.3* 14.9*  CREATININE 1.29* 1.31*  --  1.57*  --  1.24  --   LATICACIDVEN 5.8* 3.6* 2.8*  --  1.1  --   --     Estimated Creatinine Clearance: 91.9 mL/min (by C-G formula based on SCr of 1.24 mg/dL).    Allergies  Allergen Reactions  . Reglan [Metoclopramide] Other (See Comments)    anxious    Antimicrobials this admission: 7/3 Unasyn 3 gm IV q6h >>   Microbiology results: 7/3 BCx: ngtd 7/3 UCx: negative 7/3 Sputum: sent 7/3 MRSA PCR: negative 7/3 COVID negative 7/5 BCx: sent  Thank you for allowing pharmacy to be a part of this patient's care.  Sloan Leiter, PharmD, BCPS, BCCCP Clinical Pharmacist Please refer to [facility] for [place] numbers 09/28/2018 10:28 AM

## 2018-09-28 NOTE — Progress Notes (Signed)
PROGRESS NOTE    Richard Rangel  NWG:[id number] DOB: 09-Jan-1986 DOA: 09/25/2018 PCP: Patient, No Pcp Per   Brief Narrative:  Patient is a 33 year old male with history of hypertension who was brought to the emergency department after he was found to be semiconscious in a hotel room.  He was obtunded on arrival to the emergency department and required intubation.  UDS was positive for cocaine, cannabis.  Ethanol level is more than 100.  Chest x-ray suggestive of ARDS.  Also found to have acute kidney injury.  He has been finally extubated and transferred to [facility] service on 7/5.  Assessment & Plan:   Active Problems:   ARDS (adult respiratory distress syndrome) (HCC)   Accidental drug overdose   ARDS/acute hypoxic respiratory failure: Thought to be secondary to aspiration event in the setting of overdose.  Had to be intubated on admission.  Finally extubated.  Chest Xray this morning on 09/27/18 showed  persistent bilateral pulmonary infiltrates, worsened in the left upper lobe. Continue aspiration precaution.  Will try to wean the oxygen to off.  Aspiration pneumonia: Continue current antibiotics.  Currently on Unasyn.  Afebrile this morning.  Follow-up blood cultures.   AKI: Resolved.  Hypomagnesemia: Supplemented and corrected.  Overdose/substance abuse: UDS positive for cocaine and cannabis.  Counseled for cessation.  History of hypertension: Not on medicines at home.  Currently blood pressure stable.  Generalized weakness: Patient seen by physical therapy.  No recommendation for follow-up  Nutrition Problem: Inadequate oral intake Etiology: inability to eat      DVT prophylaxis: Heparin [place] Code Status: Full Family Communication: None present at the bedside Disposition Plan: Likely home in 2 to 3 days   Consultants: PCCM  Procedures: Intubation/extubation  Antimicrobials:  Anti-infectives (From admission, onward)   Start     Dose/Rate Route Frequency Ordered Stop   09/25/18 1300  Ampicillin-Sulbactam (UNASYN) 3 g in sodium chloride 0.9 % 100 mL IVPB     3 g 200 mL/hr over 30 Minutes Intravenous Every 6 hours 09/25/18 1214        Subjective:  Patient seen and examined the bedside this morning.  Looks more comfortable.  More alert.  But still sleepy/drowsy.  Requesting for shower.  I have encouraged him for ambulation.  Objective: Vitals:   09/27/18 1941 09/28/18 0449 09/28/18 0742 09/28/18 1439  BP:  128/88    Pulse:  93    Resp:  17    Temp:  98.7 F (37.1 C)    TempSrc:  Oral    SpO2: 98% 100% 99% 95%  Weight:      Height:        Intake/Output Summary (Last 24 hours) at 09/28/2018 1506 Last data filed at 09/28/2018 0551 Gross per 24 hour  Intake 2327.5 ml  Output 600 ml  Net 1727.5 ml   Filed Weights   09/25/18 0900 09/26/18 0500 09/27/18 0500  Weight: 100 kg 81.6 kg 82.8 kg    Examination:    General exam: Sleepy, lying in bed Respiratory system: Bilateral decreased air entry, with no wheezes or crackles Cardiovascular system: S1 & S2 heard, RRR. No JVD, murmurs, rubs, gallops or clicks. Gastrointestinal system: Abdomen is nondistended, soft and nontender. No organomegaly or masses felt. Normal bowel sounds heard. Central nervous system: Alert and oriented. No focal neurological deficits. Extremities: No edema, no clubbing ,no cyanosis, distal peripheral pulses palpable. Skin: No rashes, lesions or ulcers,no icterus ,no pallor    Data Reviewed: I have personally reviewed following  labs and imaging studies  CBC: Recent Labs  Lab 09/25/18 0901  09/25/18 1225  09/26/18 0339 09/26/18 0840 09/27/18 0128 09/27/18 0406 09/28/18 0447  WBC 20.7*  --  5.5  --  14.9*  --   --  18.3* 14.9*  NEUTROABS 16.7*  --   --   --  12.5*  --   --   --  12.6*  HGB 15.6   < > 16.9   < > 14.7 13.9 12.9* 12.2* 12.1*  HCT 50.4   < > 53.2*   < > 45.0 41.0 38.0* 36.4* 36.1*  MCV 93.7  --  91.4  --  89.5  --   --  89.2 87.4  PLT 198  --  199   --  161  --   --  129* 136*   < > = values in this interval not displayed.   Basic Metabolic Panel: Recent Labs  Lab 09/25/18 0901  09/25/18 1225 09/25/18 1256 09/26/18 0339 09/26/18 0830 09/26/18 0840 09/27/18 0128 09/27/18 0406  NA 143   < > 142 140 142  --  140 139 139  K 4.0   < > 4.8 4.6 4.9  --  4.0 3.6 3.8  CL 107  --  110  --  110  --   --   --  108  CO2 19*  --  23  --  19*  --   --   --  24  GLUCOSE 241*  --  101*  --  96  --   --   --  91  BUN 13  --  13  --  20  --   --   --  14  CREATININE 1.29*  --  1.31*  --  1.57*  --   --   --  1.24  CALCIUM 8.8*  --  8.7*  --  7.8*  --   --   --  8.3*  MG  --   --  1.6*  --  1.3* 1.2*  --   --   --   PHOS  --   --  3.5  --  3.5 3.2  --   --   --    < > = values in this interval not displayed.   GFR: Estimated Creatinine Clearance: 91.9 mL/min (by C-G formula based on SCr of 1.24 mg/dL). Liver Function Tests: Recent Labs  Lab 09/25/18 0901 09/25/18 1225 09/27/18 0406  AST 33 33 26  ALT 23 24 15   ALKPHOS 82 73 65  BILITOT 0.5 0.6 1.0  PROT 7.1 6.6 5.7*  ALBUMIN 3.9 3.6 2.6*   Recent Labs  Lab 09/25/18 1225  AMYLASE 116*   No results for input(s): AMMONIA in the last 168 hours. Coagulation Profile: Recent Labs  Lab 09/25/18 0901 09/25/18 1225  INR 1.1 1.1   Cardiac Enzymes: Recent Labs  Lab 09/26/18 0830  CKTOTAL 258   BNP (last 3 results) No results for input(s): PROBNP in the last 8760 hours. HbA1C: No results for input(s): HGBA1C in the last 72 hours. CBG: Recent Labs  Lab 09/26/18 0346 09/26/18 0754 09/26/18 1222 09/26/18 1612 09/26/18 2026  GLUCAP 89 88 115* 73 86   Lipid Profile: Recent Labs    09/28/18 0930  TRIG 207*   Thyroid Function Tests: No results for input(s): TSH, T4TOTAL, FREET4, T3FREE, THYROIDAB in the last 72 hours. Anemia Panel: No results for input(s): VITAMINB12, FOLATE, FERRITIN, TIBC, IRON, RETICCTPCT in the last  72 hours. Sepsis Labs: Recent Labs  Lab  09/25/18 0901 09/25/18 1225 09/25/18 1405 09/27/18 0118  PROCALCITON  --  1.43  --   --   LATICACIDVEN 5.8* 3.6* 2.8* 1.1    Recent Results (from the past 240 hour(s))  SARS Coronavirus 2 (CEPHEID - Performed in [facility] lab), Hosp Order     Status: None   Collection Time: 09/25/18  9:03 AM   Specimen: Nasopharyngeal Swab  Result Value Ref Range Status   SARS Coronavirus 2 NEGATIVE NEGATIVE Final    Comment: (NOTE) If result is NEGATIVE SARS-CoV-2 target nucleic acids are NOT DETECTED. The SARS-CoV-2 RNA is generally detectable in upper and lower  respiratory specimens during the acute phase of infection. The lowest  concentration of SARS-CoV-2 viral copies this assay can detect is 250  copies / mL. A negative result does not preclude SARS-CoV-2 infection  and should not be used as the sole basis for treatment or other  patient management decisions.  A negative result may occur with  improper specimen collection / handling, submission of specimen other  than nasopharyngeal swab, presence of viral mutation(s) within the  areas targeted by this assay, and inadequate number of viral copies  (<250 copies / mL). A negative result must be combined with clinical  observations, patient history, and epidemiological information. If result is POSITIVE SARS-CoV-2 target nucleic acids are DETECTED. The SARS-CoV-2 RNA is generally detectable in upper and lower  respiratory specimens dur ing the acute phase of infection.  Positive  results are indicative of active infection with SARS-CoV-2.  Clinical  correlation with patient history and other diagnostic information is  necessary to determine patient infection status.  Positive results do  not rule out bacterial infection or co-infection with other viruses. If result is PRESUMPTIVE POSTIVE SARS-CoV-2 nucleic acids MAY BE PRESENT.   A presumptive positive result was obtained on the submitted specimen  and confirmed on repeat  testing.  While 2019 novel coronavirus  (SARS-CoV-2) nucleic acids may be present in the submitted sample  additional confirmatory testing may be necessary for epidemiological  and / or clinical management purposes  to differentiate between  SARS-CoV-2 and other Sarbecovirus currently known to infect humans.  If clinically indicated additional testing with an alternate test  methodology [phone]([id number]) is advised. The SARS-CoV-2 RNA is generally  detectable in upper and lower respiratory sp ecimens during the acute  phase of infection. The expected result is Negative. Fact Sheet for Patients:  BoilerBrush.com.cyhttps://www.fda.gov/media/136312/download Fact Sheet for Healthcare Providers: https://pope.com/https://www.fda.gov/media/136313/download This test is not yet approved or cleared by the [place] FDA and has been authorized for detection and/or diagnosis of SARS-CoV-2 by FDA under an Emergency Use Authorization (EUA).  This EUA will remain in effect (meaning this test can be used) for the duration of the COVID-19 declaration under Section 564(b)(1) of the Act, 21 U.S.C. section 360bbb-3(b)(1), unless the authorization is terminated or revoked sooner. Performed at [facility], [address] [place], [place] [address]   Urine culture     Status: None   Collection Time: 09/25/18  9:50 AM   Specimen: Urine, Random  Result Value Ref Range Status   Specimen Description URINE, RANDOM  Final   Special Requests NONE  Final   Culture   Final    NO GROWTH Performed at [facility], [address] [place], [place] [address]    Report Status 09/26/2018 FINAL  Final  MRSA PCR Screening  Status: None   Collection Time: 09/25/18  1:31 PM   Specimen: Nasopharyngeal  Result Value Ref Range Status   MRSA by PCR NEGATIVE NEGATIVE Final    Comment:        The GeneXpert MRSA Assay (FDA approved for NASAL specimens only), is one component of a comprehensive MRSA colonization surveillance program. It is  not intended to diagnose MRSA infection nor to guide or monitor treatment for MRSA infections. Performed at [facility], [address] [place], [place] [address]   Culture, blood (routine x 2)     Status: None (Preliminary result)   Collection Time: 09/25/18  2:15 PM   Specimen: BLOOD  Result Value Ref Range Status   Specimen Description BLOOD LEFT ANTECUBITAL  Final   Special Requests   Final    BOTTLES DRAWN AEROBIC AND ANAEROBIC Blood Culture adequate volume   Culture   Final    NO GROWTH 3 DAYS Performed at [facility], [address] [place], [place] [address]    Report Status PENDING  Incomplete  Culture, blood (routine x 2)     Status: None (Preliminary result)   Collection Time: 09/25/18  2:15 PM   Specimen: BLOOD  Result Value Ref Range Status   Specimen Description BLOOD RIGHT ANTECUBITAL  Final   Special Requests   Final    BOTTLES DRAWN AEROBIC AND ANAEROBIC Blood Culture adequate volume   Culture   Final    NO GROWTH 3 DAYS Performed at [facility], [address], [place], [place] [address]    Report Status PENDING  Incomplete  Culture, blood (routine x 2)     Status: None (Preliminary result)   Collection Time: 09/27/18 12:58 PM   Specimen: BLOOD  Result Value Ref Range Status   Specimen Description BLOOD RIGHT ANTECUBITAL  Final   Special Requests   Final    BOTTLES DRAWN AEROBIC ONLY Blood Culture adequate volume   Culture   Final    NO GROWTH < 24 HOURS Performed at [facility], [address], [place], [place] [address]    Report Status PENDING  Incomplete  Culture, blood (routine x 2)     Status: None (Preliminary result)   Collection Time: 09/27/18  1:24 PM   Specimen: BLOOD  Result Value Ref Range Status   Specimen Description BLOOD RIGHT ANTECUBITAL  Final   Special Requests   Final    BOTTLES DRAWN AEROBIC ONLY Blood Culture adequate volume   Culture   Final    NO GROWTH < 24 HOURS Performed at [facility], [address], [place], [place] [address]    Report Status PENDING  Incomplete         Radiology Studies: Dg Chest Port 1 View  Result Date: 09/27/2018 CLINICAL DATA:  ARDS. EXAM: PORTABLE CHEST 1 VIEW COMPARISON:  Chest x-ray from yesterday. FINDINGS: Interval removal of the endotracheal and enteric tubes. Unchanged right internal jugular central venous catheter. The heart size and mediastinal contours are within normal limits. Bilateral pulmonary infiltrates again noted, worsened in the left upper lobe. No pleural effusion or pneumothorax. No acute osseous abnormality. IMPRESSION: 1. Persistent bilateral pulmonary infiltrates, worsened in the left upper lobe. Electronically Signed   By: Obie Dredge M.D.   On: 09/27/2018 08:23        Scheduled Meds: . Chlorhexidine Gluconate Cloth  6 each Topical Daily  . famotidine  20 mg Oral BID  .  folic acid  1 mg Oral Daily  . heparin injection (subcutaneous)  5,000 Units Subcutaneous Q8H  . ipratropium-albuterol  3 mL Nebulization TID  . thiamine  100 mg Oral Daily   Continuous Infusions: . sodium chloride 75 mL/hr at 09/27/18 2106  . ampicillin-sulbactam (UNASYN) IV 3 g (09/28/18 1313)     LOS: 3 days    Time spent: 35 mins. More than 50% of that time was spent in counseling and/or coordination of care.      Burnadette PopAmrit Kazandra Forstrom, MD [facility] Pager [phone]  If 7PM-7AM, please contact night-coverage [email or web address] Password [email or web address] 09/28/2018, 3:06 PM

## 2018-09-29 ENCOUNTER — Encounter (HOSPITAL_COMMUNITY): Payer: Self-pay | Admitting: *Deleted

## 2018-09-29 LAB — CBC WITH DIFFERENTIAL/PLATELET
Abs Immature Granulocytes: 0.06 10*3/uL (ref 0.00–0.07)
Basophils Absolute: 0.1 10*3/uL (ref 0.0–0.1)
Basophils Relative: 1 %
Eosinophils Absolute: 0.4 10*3/uL (ref 0.0–0.5)
Eosinophils Relative: 4 %
HCT: 35 % — ABNORMAL LOW (ref 39.0–52.0)
Hemoglobin: 12 g/dL — ABNORMAL LOW (ref 13.0–17.0)
Immature Granulocytes: 1 %
Lymphocytes Relative: 18 %
Lymphs Abs: 1.9 10*3/uL (ref 0.7–4.0)
MCH: 29.4 pg (ref 26.0–34.0)
MCHC: 34.3 g/dL (ref 30.0–36.0)
MCV: 85.8 fL (ref 80.0–100.0)
Monocytes Absolute: 0.6 10*3/uL (ref 0.1–1.0)
Monocytes Relative: 5 %
Neutro Abs: 7.9 10*3/uL — ABNORMAL HIGH (ref 1.7–7.7)
Neutrophils Relative %: 71 %
Platelets: 176 10*3/uL (ref 150–400)
RBC: 4.08 MIL/uL — ABNORMAL LOW (ref 4.22–5.81)
RDW: 13 % (ref 11.5–15.5)
WBC: 11 10*3/uL — ABNORMAL HIGH (ref 4.0–10.5)
nRBC: 0 % (ref 0.0–0.2)

## 2018-09-29 LAB — LEGIONELLA PNEUMOPHILA SEROGP 1 UR AG: L. pneumophila Serogp 1 Ur Ag: NEGATIVE

## 2018-09-29 MED ORDER — AMLODIPINE BESYLATE 10 MG PO TABS: 10.0000 mg | ORAL_TABLET | Freq: Every day | ORAL | 0 refills | Status: AC

## 2018-09-29 MED ORDER — AMLODIPINE BESYLATE 10 MG PO TABS
10.0000 mg | ORAL_TABLET | Freq: Every day | ORAL | Status: DC
  Administered 2018-09-29: 12:00:00 10 mg via ORAL
  Filled 2018-09-29: qty 1

## 2018-09-29 MED ORDER — AMOXICILLIN-POT CLAVULANATE 875-125 MG PO TABS: 1.0000 | ORAL_TABLET | Freq: Two times a day (BID) | ORAL | 0 refills | Status: AC

## 2018-09-29 MED FILL — AMOX-CLAV 875-125 MG TABLET: 875-125 | 3 days supply | Qty: 6 | Fill #0

## 2018-09-29 MED FILL — AMLODIPINE BESYLATE 10 MG T: 10 | 30 days supply | Qty: 30 | Fill #0

## 2018-09-29 NOTE — Plan of Care (Signed)
  Problem: Education: Goal: Knowledge of General Education information will improve Description: Including pain rating scale, medication(s)/side effects and non-pharmacologic comfort measures Outcome: Progressing   Problem: Health Behavior/Discharge Planning: Goal: Ability to manage health-related needs will improve Outcome: Progressing   Problem: Clinical Measurements: Goal: Will remain free from infection Outcome: Progressing   Problem: Clinical Measurements: Goal: Respiratory complications will improve Outcome: Progressing   

## 2018-09-29 NOTE — Discharge Instructions (Signed)
Aspiration Pneumonia °Aspiration pneumonia is an infection in the lungs. It occurs when saliva or liquid contaminated with bacteria is inhaled (aspirated) into the lungs. When these things get into the lungs, swelling (inflammation) and infection can occur. This can make it difficult to breathe. Aspiration pneumonia is a serious condition and can be life threatening. °What are the causes? °This condition is caused when saliva or liquid from the mouth, throat, or stomach is inhaled into the lungs, and when those fluids are contaminated with bacteria. °What increases the risk? °The following factors may make you more likely to develop this condition: °· A narrowing of the tube that carries food to the stomach (esophageal narrowing). °· Having gastroesophageal reflux disease (GERD). °· Having a weak immune system. °· Having diabetes. °· Having poor oral hygiene. °· Being malnourished. °The condition is more likely to occur when a person's cough (gag) reflex, or ability to swallow, has decreased. Some things that can cause this decrease include: °· Having a brain injury or disease, such as stroke, seizures, Parkinson disease, dementia, or amyotrophic lateral sclerosis (ALS). °· Being given a general anesthetic for procedures. °· Drinking too much alcohol. If a person passes out and vomits, vomit can be inhaled into the lungs. °· Taking certain medicines, such as tranquilizers or sedatives. °What are the signs or symptoms? °Symptoms of this condition include: °· Fever. °· A cough with secretions that are yellow, tan, or green. °· Breathing problems, such as wheezing or shortness of breath. °· Chest pain. °· Being more tired than usual (fatigue). °· Having a history of coughing while eating or drinking. °· Bad breath. °· Bluish color to the lips, skin, or fingers. °How is this diagnosed? °This condition may be diagnosed based on: °· A physical exam. °· Tests, such as: °? Chest X-ray. °? Sputum culture. Saliva and mucus  (sputum) are collected from the lungs or the tubes that carry air to the lungs (bronchi). The sputum is then tested for bacteria. °? Oximetry. A sensor or clip is placed on areas such as a finger, earlobe, or toe to measure the oxygen level in your blood. °? Blood tests. °? Swallowing study. This test looks at how food is swallowed and whether it goes into your breathing tube (trachea) or esophagus. °? Bronchoscopy. This test uses a flexible tube (bronchoscope) to see inside the lungs. °How is this treated? °This condition may be treated with: °· Medicines. Antibiotic medicine will be given to kill the pneumonia bacteria. Other medicines may also be used to reduce fever or pain. °· Breathing assistance and oxygen therapy. Depending on how well you are breathing, you may need to be given oxygen, or you may need breathing support from a breathing machine (ventilator). °· Thoracentesis. This is a procedure to remove fluid that has built up in the space between the linings of the chest wall and the lungs. °· Feeding tube and diet change. For people who have difficulty swallowing, a feeding tube might be placed in the stomach, or they may be asked to avoid certain food textures or liquids when eating. °Follow these instructions at home: °Medicines °· Take over-the-counter and prescription medicines only as told by your health care provider. °? If you were prescribed an antibiotic medicine, take it as told by your health care provider. Do not stop taking the antibiotic even if you start to feel better. °? Take cough medicine only if you are losing sleep. Cough medicine can prevent your body’s natural ability to remove mucus   from your lungs. °General instructions °· Carefully follow any eating instructions you were given, such as avoiding certain food textures or thickening your liquids. Thickening liquids reduces the risk of developing aspiration pneumonia again. °· Use breathing exercises such as postural drainage, deep  breathing, and incentive spirometry to help expel secretions. °· Rest as instructed by your health care provider. °· Sleep in a semi-upright position at night. Try to sleep in a reclining chair, or place a few pillows under your head. °· Do not use any products that contain nicotine or tobacco, such as cigarettes and e-cigarettes. If you need help quitting, ask your health care provider. °· Keep all follow-up visits as told by your health care provider. This is important. °Contact a health care provider if: °· You have a fever. °· You have a worsening cough with yellow, tan, or green secretions. °· You have coughing while eating or drinking. °Get help right away if: °· You have worsening shortness of breath, wheezing, or difficulty breathing. °· You have chest pain. °Summary °· Aspiration pneumonia is an infection in the lungs. It is caused when saliva or liquid from the mouth, throat, or stomach is inhaled into the lungs. °· Aspiration pneumonia is more likely to occur when a person's cough reflex or ability to swallow has decreased. °· Symptoms of aspiration pneumonia include coughing, breathing problems, fever, and chest pain. °· Aspiration pneumonia may be treated with antibiotic medicine, other medicines to reduce pain or fever, and breathing assistance or oxygen therapy. °This information is not intended to replace advice given to you by your health care provider. Make sure you discuss any questions you have with your health care provider. °Document Released: 01/06/2009 Document Revised: 02/21/2017 Document Reviewed: 04/16/2016 °Elsevier Patient Education © 2020 [employer] ° °

## 2018-09-29 NOTE — Discharge Summary (Signed)
Physician Discharge Summary  Richard Rangel XVQ:[id number] DOB: [birth year] DOA: 09/25/2018  PCP: Patient, No Pcp Per  Admit date: 09/25/2018 Discharge date: 09/29/2018  Admitted From: Home Disposition:  Home  Discharge Condition:Stable CODE STATUS:FULL Diet recommendation: Heart Healthy  Brief/Interim Summary: Patient is a 33 year old male with history of hypertension who was brought to the emergency department after he was found to be semiconscious in a hotel room.  He was obtunded on arrival to the emergency department and required intubation.  UDS was positive for cocaine, cannabis.  Ethanol level is more than 100.  Chest x-ray suggestive of ARDS.  Also found to have acute kidney injury.  He has been finally extubated and transferred to [facility] service on 7/5. Patient respiratory status has been stable.  Currently he is saturating fine on room air.  He is hemodynamically stable for discharge.  Following problems were addressed during his hospitalization:  ARDS/acute hypoxic respiratory failure: Thought to be secondary to aspiration event in the setting of overdose.  Had to be intubated on admission.  Finally extubated.  Chest Xray this morning on 09/27/18 showed  persistent bilateral pulmonary infiltrates, worsened in the left upper lobe. Clinically he has improved significantly.  Currently saturating fine on room air.  Aspiration pneumonia: Continue current antibiotics.  Currently on Unasyn.  Afebrile this morning.    Cultures negative.  Continue antibiotics for 3 more days.  AKI: Resolved.  Hypomagnesemia: Supplemented and corrected.  Overdose/substance abuse: UDS positive for cocaine and cannabis.  Counseled for cessation.  History of hypertension: Not on medicines at home.    Started on amlodipine  Generalized weakness: Patient seen by physical therapy.  No recommendation for follow-up     Discharge Diagnoses:  Active Problems:   ARDS (adult respiratory distress syndrome)  (HCC)   Accidental drug overdose    Discharge Instructions  Discharge Instructions    Diet - low sodium heart healthy   Complete by: As directed    Discharge instructions   Complete by: As directed    1)Please stop drug abuse. 2)Take prescribed medicines as instructed. 3)Follow up with PCP in a week.  Monitor your blood pressure.   Increase activity slowly   Complete by: As directed      Allergies as of 09/29/2018      Reactions   Reglan [metoclopramide] Other (See Comments)   anxious      Medication List    STOP taking these medications   PRESCRIPTION MEDICATION   PRESCRIPTION MEDICATION     TAKE these medications   amLODipine 10 MG tablet Commonly known as: NORVASC Take 1 tablet (10 mg total) by mouth daily.   amoxicillin-clavulanate 875-125 MG tablet Commonly known as: Augmentin Take 1 tablet by mouth 2 (two) times daily for 3 days.       Allergies  Allergen Reactions  . Reglan [Metoclopramide] Other (See Comments)    anxious    Consultations:  PCCM   Procedures/Studies: Ct Head Wo Contrast  Result Date: 09/25/2018 CLINICAL DATA:  Altered level consciousness, respiratory failure, overdose EXAM: CT HEAD WITHOUT CONTRAST TECHNIQUE: Contiguous axial images were obtained from the base of the skull through the vertex without intravenous contrast. COMPARISON:  None. FINDINGS: Brain: No acute intracranial hemorrhage, mass lesion, new infarction, midline shift, herniation hydrocephalus, extra-axial collection. Gray-white matter differentiation maintained. No focal mass effect or edema. Cisterns are patent. No gross cerebellar abnormality. Vascular: No hyperdense vessel or unexpected calcification. Skull: Normal. Negative for fracture or focal lesion. Sinuses/Orbits: No acute finding. Other:  None. IMPRESSION: No acute intracranial abnormality by noncontrast CT. Electronically Signed   By: Judie Petit.  Shick M.D.   On: 09/25/2018 10:45   Dg Chest Port 1 View  Result Date:  09/27/2018 CLINICAL DATA:  ARDS. EXAM: PORTABLE CHEST 1 VIEW COMPARISON:  Chest x-ray from yesterday. FINDINGS: Interval removal of the endotracheal and enteric tubes. Unchanged right internal jugular central venous catheter. The heart size and mediastinal contours are within normal limits. Bilateral pulmonary infiltrates again noted, worsened in the left upper lobe. No pleural effusion or pneumothorax. No acute osseous abnormality. IMPRESSION: 1. Persistent bilateral pulmonary infiltrates, worsened in the left upper lobe. Electronically Signed   By: Obie Dredge M.D.   On: 09/27/2018 08:23   Dg Chest Port 1 View  Result Date: 09/26/2018 CLINICAL DATA:  ARDS. EXAM: PORTABLE CHEST 1 VIEW COMPARISON:  Chest x-ray from yesterday. FINDINGS: Unchanged endotracheal and enteric tubes. Unchanged right internal jugular central venous catheter. The heart size and mediastinal contours are within normal limits. Bilateral pulmonary infiltrates again noted, mildly improved. No pleural effusion or pneumothorax. No acute osseous abnormality. IMPRESSION: 1. Improving bilateral pulmonary infiltrates. Electronically Signed   By: Obie Dredge M.D.   On: 09/26/2018 08:43   Dg Chest Port 1 View  Result Date: 09/25/2018 CLINICAL DATA:  Verify placement of central venous catheter. EXAM: PORTABLE CHEST 1 VIEW COMPARISON:  September 25, 2018 FINDINGS: The ETT is in good position. The NG tube terminates below today's film. A new right IJ terminates in the central SVC. No pneumothorax. The heart, hila, mediastinum are normal. No pulmonary nodules or masses. Bilateral pulmonary infiltrates remain. IMPRESSION: 1. Support apparatus as above. No pneumothorax after right central line placement. 2. Bilateral pulmonary infiltrates remain, mildly improved in the interval. Electronically Signed   By: Gerome Sam III M.D   On: 09/25/2018 17:13   Dg Chest Portable 1 View  Result Date: 09/25/2018 CLINICAL DATA:  Respiratory failure, possible  overdose EXAM: PORTABLE CHEST 1 VIEW COMPARISON:  09/25/2018 FINDINGS: Endotracheal tube 4.2 cm above the carina. Severe bilateral airspace process persists. Little change in aeration. No enlarging effusion or pneumothorax. Normal heart size. IMPRESSION: Endotracheal tube 4.2 cm above the carina. Severe bilateral airspace process persists. Electronically Signed   By: Judie Petit.  Shick M.D.   On: 09/25/2018 10:42   Dg Chest Portable 1 View  Result Date: 09/25/2018 CLINICAL DATA:  Intubation, suspected overdose EXAM: PORTABLE CHEST 1 VIEW COMPARISON:  None. FINDINGS: Endotracheal tube 2.1 cm above the carina. Severe bilateral airspace opacities with central air bronchograms compatible with pneumonia, hemorrhage, edema or aspiration. No large effusion or pneumothorax. Trachea midline. Normal heart size and vascularity. No acute osseous finding. IMPRESSION: Endotracheal tube 2.1 cm above the carina Severe bilateral airspace process. Electronically Signed   By: Judie Petit.  Shick M.D.   On: 09/25/2018 10:11       Subjective:  Patient seen and examined the bedside this morning.  Remains comfortable.  Hemodynamically stable for discharge.  Discharge Exam: Vitals:   09/29/18 0359 09/29/18 0748  BP: (!) 167/105   Pulse: 63   Resp: 16   Temp: 98.4 F (36.9 C)   SpO2: 100% 97%   Vitals:   09/28/18 2003 09/28/18 2006 09/29/18 0359 09/29/18 0748  BP: (!) 149/89  (!) 167/105   Pulse: (!) 104  63   Resp: 16  16   Temp: 99.8 F (37.7 C)  98.4 F (36.9 C)   TempSrc: Oral  Oral   SpO2: 99% 98% 100% 97%  Weight:   81.7 kg   Height:        General: Pt is alert, awake, not in acute distress Cardiovascular: RRR, S1/S2 +, no rubs, no gallops Respiratory: CTA bilaterally, no wheezing, no rhonchi Abdominal: Soft, NT, ND, bowel sounds + Extremities: no edema, no cyanosis    The results of significant diagnostics from this hospitalization (including imaging, microbiology, ancillary and laboratory) are listed below  for reference.     Microbiology: Recent Results (from the past 240 hour(s))  SARS Coronavirus 2 (CEPHEID - Performed in [facility] hospital lab), Hosp Order     Status: None   Collection Time: 09/25/18  9:03 AM   Specimen: Nasopharyngeal Swab  Result Value Ref Range Status   SARS Coronavirus 2 NEGATIVE NEGATIVE Final    Comment: (NOTE) If result is NEGATIVE SARS-CoV-2 target nucleic acids are NOT DETECTED. The SARS-CoV-2 RNA is generally detectable in upper and lower  respiratory specimens during the acute phase of infection. The lowest  concentration of SARS-CoV-2 viral copies this assay can detect is 250  copies / mL. A negative result does not preclude SARS-CoV-2 infection  and should not be used as the sole basis for treatment or other  patient management decisions.  A negative result may occur with  improper specimen collection / handling, submission of specimen other  than nasopharyngeal swab, presence of viral mutation(s) within the  areas targeted by this assay, and inadequate number of viral copies  (<250 copies / mL). A negative result must be combined with clinical  observations, patient history, and epidemiological information. If result is POSITIVE SARS-CoV-2 target nucleic acids are DETECTED. The SARS-CoV-2 RNA is generally detectable in upper and lower  respiratory specimens dur ing the acute phase of infection.  Positive  results are indicative of active infection with SARS-CoV-2.  Clinical  correlation with patient history and other diagnostic information is  necessary to determine patient infection status.  Positive results do  not rule out bacterial infection or co-infection with other viruses. If result is PRESUMPTIVE POSTIVE SARS-CoV-2 nucleic acids MAY BE PRESENT.   A presumptive positive result was obtained on the submitted specimen  and confirmed on repeat testing.  While 2019 novel coronavirus  (SARS-CoV-2) nucleic acids may be present in the submitted  sample  additional confirmatory testing may be necessary for epidemiological  and / or clinical management purposes  to differentiate between  SARS-CoV-2 and other Sarbecovirus currently known to infect humans.  If clinically indicated additional testing with an alternate test  methodology [phone]([id number]) is advised. The SARS-CoV-2 RNA is generally  detectable in upper and lower respiratory sp ecimens during the acute  phase of infection. The expected result is Negative. Fact Sheet for Patients:  BoilerBrush.com.cyhttps://www.fda.gov/media/136312/download Fact Sheet for Healthcare Providers: https://pope.com/https://www.fda.gov/media/136313/download This test is not yet approved or cleared by the [place] FDA and has been authorized for detection and/or diagnosis of SARS-CoV-2 by FDA under an Emergency Use Authorization (EUA).  This EUA will remain in effect (meaning this test can be used) for the duration of the COVID-19 declaration under Section 564(b)(1) of the Act, 21 U.S.C. section 360bbb-3(b)(1), unless the authorization is terminated or revoked sooner. Performed at [facility], [address] [place], [place] [address]   Urine culture     Status: None   Collection Time: 09/25/18  9:50 AM   Specimen: Urine, Random  Result Value Ref Range Status   Specimen Description URINE, RANDOM  Final   Special Requests NONE  Final   Culture   Final    NO GROWTH Performed at [facility], [address] [place], [place] [address]    Report Status 09/26/2018 FINAL  Final  MRSA PCR Screening     Status: None   Collection Time: 09/25/18  1:31 PM   Specimen: Nasopharyngeal  Result Value Ref Range Status   MRSA by PCR NEGATIVE NEGATIVE Final    Comment:        The GeneXpert MRSA Assay (FDA approved for NASAL specimens only), is one component of a comprehensive MRSA colonization surveillance program. It is not intended to diagnose MRSA infection nor to guide or monitor treatment for MRSA  infections. Performed at [facility], [address] [place], [place] [address]   Culture, blood (routine x 2)     Status: None (Preliminary result)   Collection Time: 09/25/18  2:15 PM   Specimen: BLOOD  Result Value Ref Range Status   Specimen Description BLOOD LEFT ANTECUBITAL  Final   Special Requests   Final    BOTTLES DRAWN AEROBIC AND ANAEROBIC Blood Culture adequate volume   Culture   Final    NO GROWTH 4 DAYS Performed at [facility], [address], [place], [place] [address]    Report Status PENDING  Incomplete  Culture, blood (routine x 2)     Status: None (Preliminary result)   Collection Time: 09/25/18  2:15 PM   Specimen: BLOOD  Result Value Ref Range Status   Specimen Description BLOOD RIGHT ANTECUBITAL  Final   Special Requests   Final    BOTTLES DRAWN AEROBIC AND ANAEROBIC Blood Culture adequate volume   Culture   Final    NO GROWTH 4 DAYS Performed at [facility], [address], [place], [place] [address]    Report Status PENDING  Incomplete  Culture, blood (routine x 2)     Status: None (Preliminary result)   Collection Time: 09/27/18 12:58 PM   Specimen: BLOOD  Result Value Ref Range Status   Specimen Description BLOOD RIGHT ANTECUBITAL  Final   Special Requests   Final    BOTTLES DRAWN AEROBIC ONLY Blood Culture adequate volume   Culture   Final    NO GROWTH 2 DAYS Performed at [facility], [address], [place], [place] [address]    Report Status PENDING  Incomplete  Culture, blood (routine x 2)     Status: None (Preliminary result)   Collection Time: 09/27/18  1:24 PM   Specimen: BLOOD  Result Value Ref Range Status   Specimen Description BLOOD RIGHT ANTECUBITAL  Final   Special Requests   Final    BOTTLES DRAWN AEROBIC ONLY Blood Culture adequate volume   Culture   Final    NO GROWTH 2 DAYS Performed at [facility], [address], [place], [place] [address]    Report Status PENDING  Incomplete      Labs: BNP (last 3 results) No results for input(s): BNP in the last 8760 hours. Basic Metabolic Panel: Recent Labs  Lab 09/25/18 0901  09/25/18 1225 09/25/18 1256 09/26/18 0339 09/26/18 0830 09/26/18 0840 09/27/18 0128 09/27/18 0406  NA 143   < > 142 140 142  --  140 139 139  K 4.0   < > 4.8 4.6 4.9  --  4.0 3.6 3.8  CL 107  --  110  --  110  --   --   --  108  CO2 19*  --  23  --  19*  --   --   --  24  GLUCOSE 241*  --  101*  --  96  --   --   --  91  BUN 13  --  13  --  20  --   --   --  14  CREATININE 1.29*  --  1.31*  --  1.57*  --   --   --  1.24  CALCIUM 8.8*  --  8.7*  --  7.8*  --   --   --  8.3*  MG  --   --  1.6*  --  1.3* 1.2*  --   --   --   PHOS  --   --  3.5  --  3.5 3.2  --   --   --    < > = values in this interval not displayed.   Liver Function Tests: Recent Labs  Lab 09/25/18 0901 09/25/18 1225 09/27/18 0406  AST 33 33 26  ALT 23 24 15   ALKPHOS 82 73 65  BILITOT 0.5 0.6 1.0  PROT 7.1 6.6 5.7*  ALBUMIN 3.9 3.6 2.6*   Recent Labs  Lab 09/25/18 1225  AMYLASE 116*   No results for input(s): AMMONIA in the last 168 hours. CBC: Recent Labs  Lab 09/25/18 0901  09/25/18 1225  09/26/18 0339 09/26/18 0840 09/27/18 0128 09/27/18 0406 09/28/18 0447 09/29/18 0354  WBC 20.7*  --  5.5  --  14.9*  --   --  18.3* 14.9* 11.0*  NEUTROABS 16.7*  --   --   --  12.5*  --   --   --  12.6* 7.9*  HGB 15.6   < > 16.9   < > 14.7 13.9 12.9* 12.2* 12.1* 12.0*  HCT 50.4   < > 53.2*   < > 45.0 41.0 38.0* 36.4* 36.1* 35.0*  MCV 93.7  --  91.4  --  89.5  --   --  89.2 87.4 85.8  PLT 198  --  199  --  161  --   --  129* 136* 176   < > = values in this interval not displayed.   Cardiac Enzymes: Recent Labs  Lab 09/26/18 0830  CKTOTAL 258   BNP: Invalid input(s): POCBNP CBG: Recent Labs  Lab 09/26/18 0346 09/26/18 0754 09/26/18 1222 09/26/18 1612 09/26/18 2026  GLUCAP 89 88 115* 73 86   D-Dimer No results for input(s): DDIMER in the last 72  hours. Hgb A1c No results for input(s): HGBA1C in the last 72 hours. Lipid Profile Recent Labs    09/28/18 0930  TRIG 207*   Thyroid function studies No results for input(s): TSH, T4TOTAL, T3FREE, THYROIDAB in the last 72 hours.  Invalid input(s): FREET3 Anemia work up No results for input(s): VITAMINB12, FOLATE, FERRITIN, TIBC, IRON, RETICCTPCT in the last 72 hours. Urinalysis No results found for: COLORURINE, APPEARANCEUR, LABSPEC, PHURINE, GLUCOSEU, HGBUR, BILIRUBINUR, KETONESUR, PROTEINUR, UROBILINOGEN, NITRITE, LEUKOCYTESUR Sepsis Labs Invalid input(s): PROCALCITONIN,  WBC,  LACTICIDVEN Microbiology Recent Results (from the past 240 hour(s))  SARS Coronavirus 2 (CEPHEID - Performed in [facility] hospital lab), Hosp Order     Status: None   Collection Time: 09/25/18  9:03 AM   Specimen: Nasopharyngeal Swab  Result Value Ref Range Status   SARS Coronavirus 2 NEGATIVE NEGATIVE Final    Comment: (NOTE) If result is NEGATIVE SARS-CoV-2 target nucleic acids are NOT  DETECTED. The SARS-CoV-2 RNA is generally detectable in upper and lower  respiratory specimens during the acute phase of infection. The lowest  concentration of SARS-CoV-2 viral copies this assay can detect is 250  copies / mL. A negative result does not preclude SARS-CoV-2 infection  and should not be used as the sole basis for treatment or other  patient management decisions.  A negative result may occur with  improper specimen collection / handling, submission of specimen other  than nasopharyngeal swab, presence of viral mutation(s) within the  areas targeted by this assay, and inadequate number of viral copies  (<250 copies / mL). A negative result must be combined with clinical  observations, patient history, and epidemiological information. If result is POSITIVE SARS-CoV-2 target nucleic acids are DETECTED. The SARS-CoV-2 RNA is generally detectable in upper and lower  respiratory specimens dur ing the  acute phase of infection.  Positive  results are indicative of active infection with SARS-CoV-2.  Clinical  correlation with patient history and other diagnostic information is  necessary to determine patient infection status.  Positive results do  not rule out bacterial infection or co-infection with other viruses. If result is PRESUMPTIVE POSTIVE SARS-CoV-2 nucleic acids MAY BE PRESENT.   A presumptive positive result was obtained on the submitted specimen  and confirmed on repeat testing.  While 2019 novel coronavirus  (SARS-CoV-2) nucleic acids may be present in the submitted sample  additional confirmatory testing may be necessary for epidemiological  and / or clinical management purposes  to differentiate between  SARS-CoV-2 and other Sarbecovirus currently known to infect humans.  If clinically indicated additional testing with an alternate test  methodology [phone]([id number]) is advised. The SARS-CoV-2 RNA is generally  detectable in upper and lower respiratory sp ecimens during the acute  phase of infection. The expected result is Negative. Fact Sheet for Patients:  BoilerBrush.com.cyhttps://www.fda.gov/media/136312/download Fact Sheet for Healthcare Providers: https://pope.com/https://www.fda.gov/media/136313/download This test is not yet approved or cleared by the [place] FDA and has been authorized for detection and/or diagnosis of SARS-CoV-2 by FDA under an Emergency Use Authorization (EUA).  This EUA will remain in effect (meaning this test can be used) for the duration of the COVID-19 declaration under Section 564(b)(1) of the Act, 21 U.S.C. section 360bbb-3(b)(1), unless the authorization is terminated or revoked sooner. Performed at [facility], [address] [place], [place] [address]   Urine culture     Status: None   Collection Time: 09/25/18  9:50 AM   Specimen: Urine, Random  Result Value Ref Range Status   Specimen Description URINE, RANDOM  Final   Special Requests NONE  Final    Culture   Final    NO GROWTH Performed at [facility], [address] [place], [place] [address]    Report Status 09/26/2018 FINAL  Final  MRSA PCR Screening     Status: None   Collection Time: 09/25/18  1:31 PM   Specimen: Nasopharyngeal  Result Value Ref Range Status   MRSA by PCR NEGATIVE NEGATIVE Final    Comment:        The GeneXpert MRSA Assay (FDA approved for NASAL specimens only), is one component of a comprehensive MRSA colonization surveillance program. It is not intended to diagnose MRSA infection nor to guide or monitor treatment for MRSA infections. Performed at [facility], [address] [place], [place] [address]   Culture, blood (routine x 2)     Status: None (Preliminary result)  Collection Time: 09/25/18  2:15 PM   Specimen: BLOOD  Result Value Ref Range Status   Specimen Description BLOOD LEFT ANTECUBITAL  Final   Special Requests   Final    BOTTLES DRAWN AEROBIC AND ANAEROBIC Blood Culture adequate volume   Culture   Final    NO GROWTH 4 DAYS Performed at [facility], [address] [place], [place] [address]    Report Status PENDING  Incomplete  Culture, blood (routine x 2)     Status: None (Preliminary result)   Collection Time: 09/25/18  2:15 PM   Specimen: BLOOD  Result Value Ref Range Status   Specimen Description BLOOD RIGHT ANTECUBITAL  Final   Special Requests   Final    BOTTLES DRAWN AEROBIC AND ANAEROBIC Blood Culture adequate volume   Culture   Final    NO GROWTH 4 DAYS Performed at [facility], [address], [place], [place] [address]    Report Status PENDING  Incomplete  Culture, blood (routine x 2)     Status: None (Preliminary result)   Collection Time: 09/27/18 12:58 PM   Specimen: BLOOD  Result Value Ref Range Status   Specimen Description BLOOD RIGHT ANTECUBITAL  Final   Special Requests   Final    BOTTLES DRAWN AEROBIC ONLY Blood Culture adequate volume   Culture   Final    NO GROWTH 2  DAYS Performed at [facility], [address], [place], [place] [address]    Report Status PENDING  Incomplete  Culture, blood (routine x 2)     Status: None (Preliminary result)   Collection Time: 09/27/18  1:24 PM   Specimen: BLOOD  Result Value Ref Range Status   Specimen Description BLOOD RIGHT ANTECUBITAL  Final   Special Requests   Final    BOTTLES DRAWN AEROBIC ONLY Blood Culture adequate volume   Culture   Final    NO GROWTH 2 DAYS Performed at [facility], [address], [place], [place] [address]    Report Status PENDING  Incomplete    Please note: You were cared for by a hospitalist during your hospital stay. Once you are discharged, your primary care physician will handle any further medical issues. Please note that NO REFILLS for any discharge medications will be authorized once you are discharged, as it is imperative that you return to your primary care physician (or establish a relationship with a primary care physician if you do not have one) for your post hospital discharge needs so that they can reassess your need for medications and monitor your lab values.    Time coordinating discharge: 40 minutes  SIGNED:   Burnadette Pop, MD  [facility] 09/29/2018, 10:22 AM Pager [phone]  If 7PM-7AM, please contact night-coverage [email or web address] Password [email or web address]

## 2018-09-29 NOTE — Care Management (Signed)
09-29-18 1030 Late Entry- CM received referral for hospital follow up appointment. Hospital follow up appointment scheduled at the [facility] Internal Medicine Office. CM not able to schedule appointment at the [facility]. Appointment placed on the AVS and patient is aware of scheduled appointment. Patient states he can afford medications and they were delivered via [facility]. No further needs from CM at this time. Bethena Roys, RN,BSN Case Manager [phone]

## 2018-09-30 LAB — CULTURE, BLOOD (ROUTINE X 2)
Culture: NO GROWTH
Culture: NO GROWTH
Special Requests: ADEQUATE
Special Requests: ADEQUATE

## 2018-10-02 LAB — CULTURE, BLOOD (ROUTINE X 2)
Culture: NO GROWTH
Culture: NO GROWTH
Special Requests: ADEQUATE
Special Requests: ADEQUATE

## 2021-02-21 IMAGING — DX PORTABLE CHEST - 1 VIEW
1 series · 1 of 1 positions shown · non-contrast
Comparison: 09/25/2018

CLINICAL DATA: Respiratory failure, possible overdose

EXAM:
PORTABLE CHEST 1 VIEW

[chest]
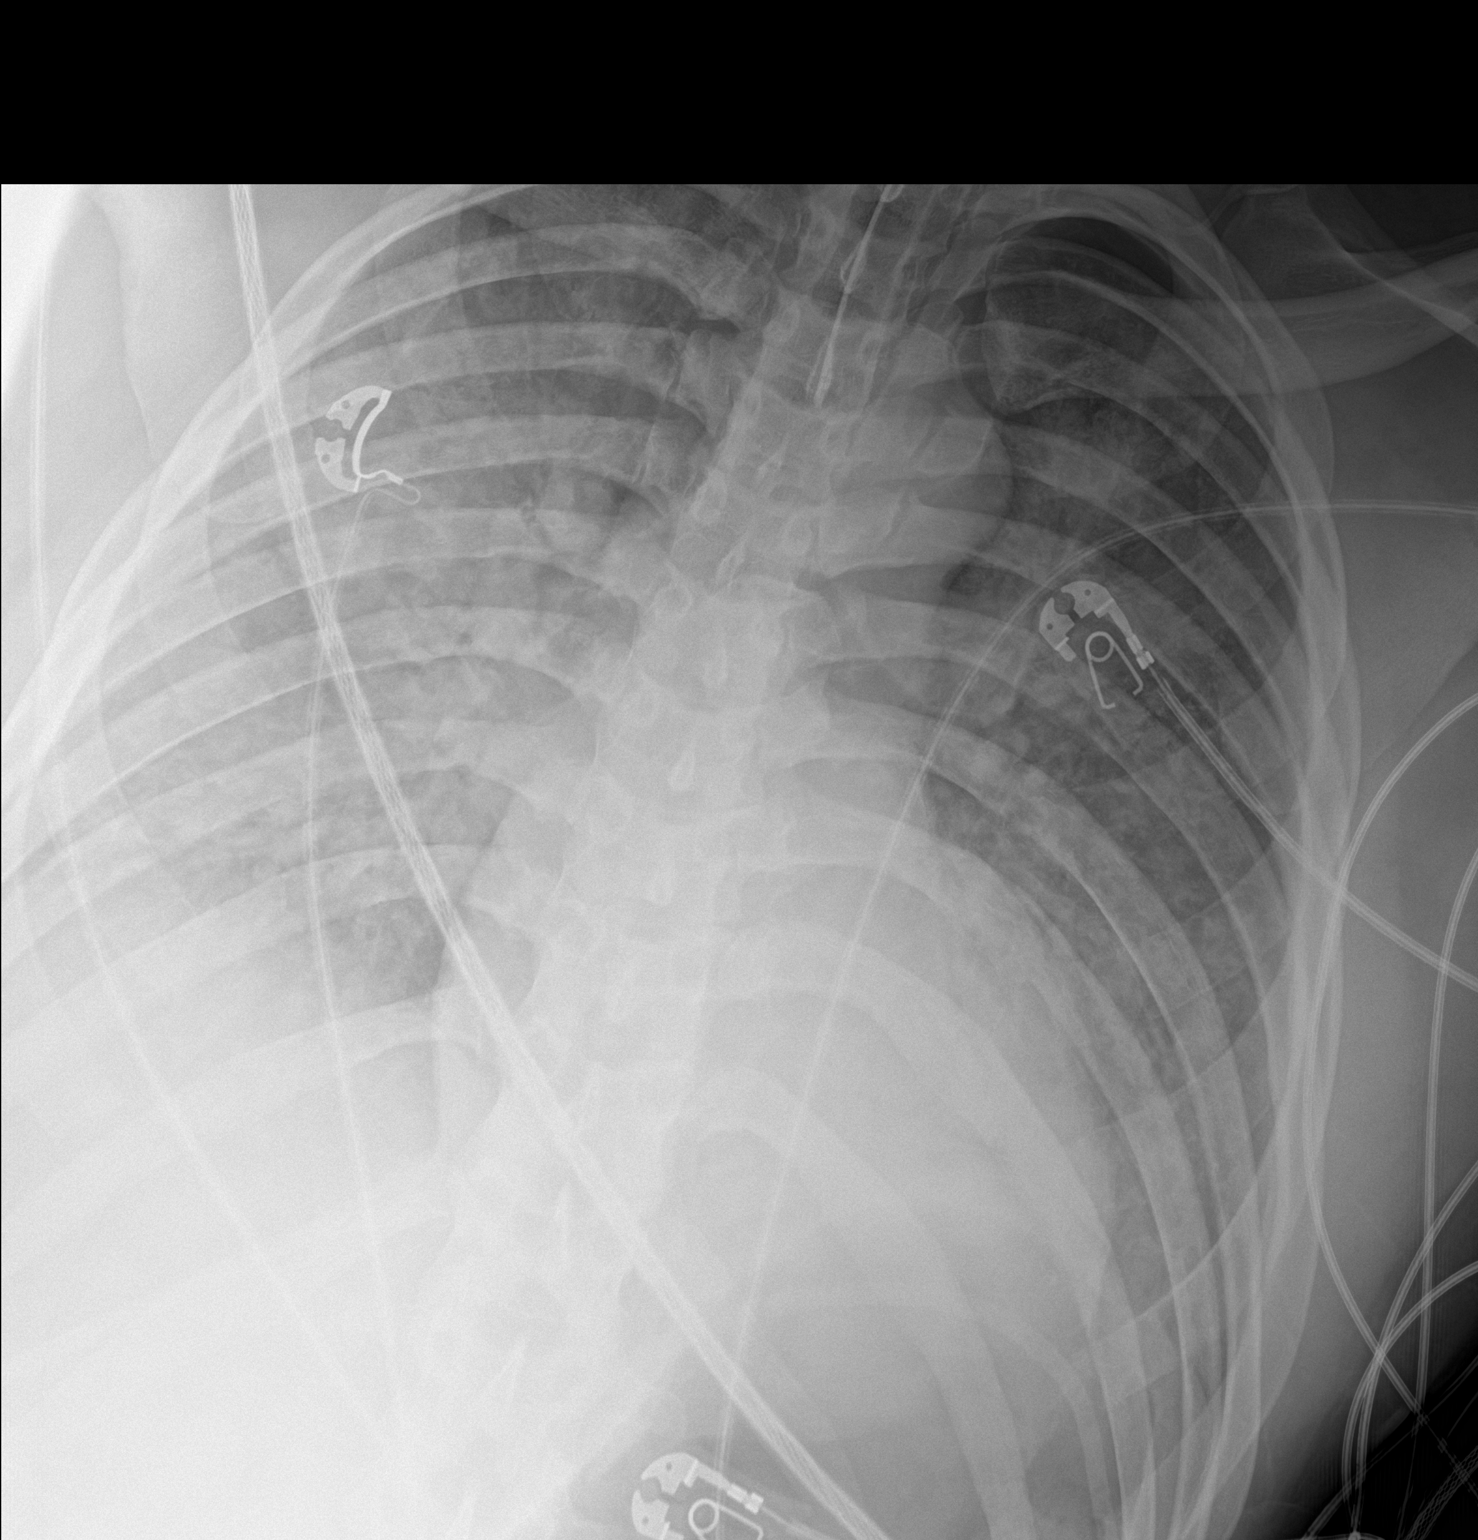

[1 of 1 positions shown; findings below may reference images not displayed]

FINDINGS: Endotracheal tube 4.2 cm above the carina. Severe bilateral airspace
process persists. Little change in aeration. No enlarging effusion
or pneumothorax. Normal heart size.
IMPRESSION: Endotracheal tube 4.2 cm above the carina.

Severe bilateral airspace process persists.

## 2021-02-23 IMAGING — DX PORTABLE CHEST - 1 VIEW
1 series · 1 of 1 positions shown · non-contrast
Comparison: Chest x-ray from yesterday.

CLINICAL DATA: ARDS.

EXAM:
PORTABLE CHEST 1 VIEW

[chest ap]
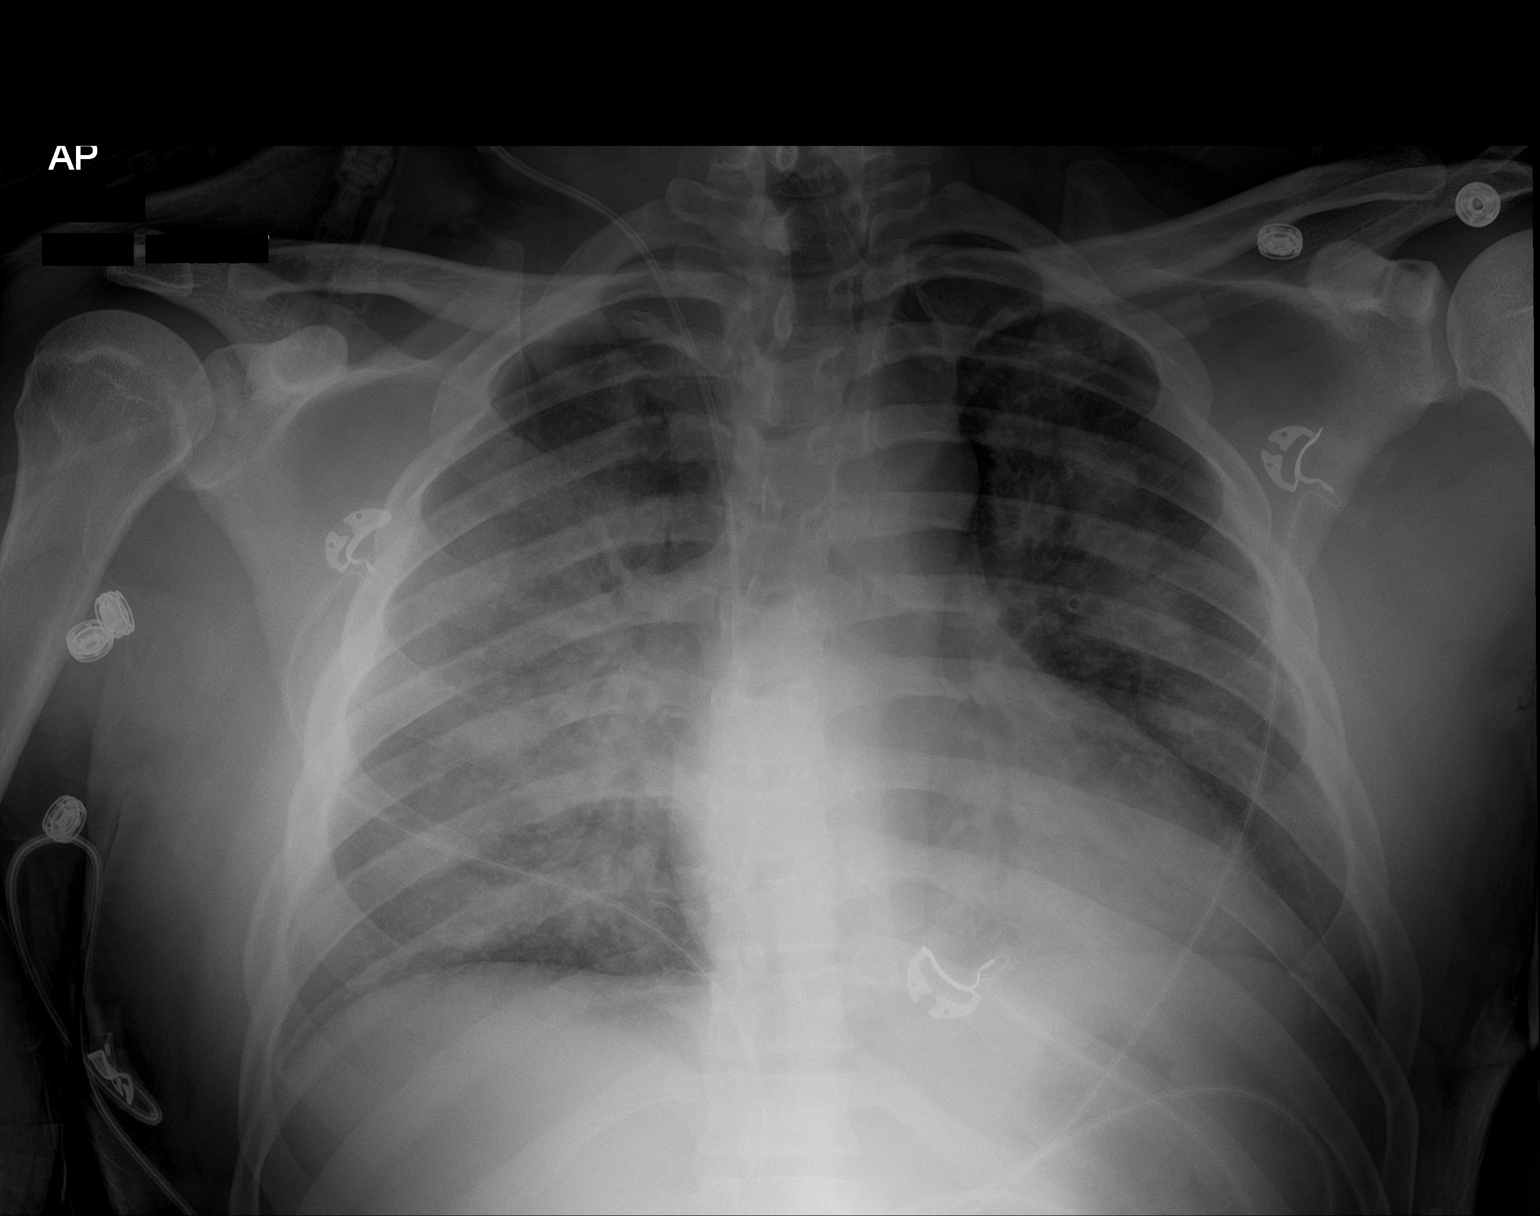

[1 of 1 positions shown; findings below may reference images not displayed]

FINDINGS: Interval removal of the endotracheal and enteric tubes. Unchanged
right internal jugular central venous catheter. The heart size and
mediastinal contours are within normal limits. Bilateral pulmonary
infiltrates again noted, worsened in the left upper lobe. No pleural
effusion or pneumothorax. No acute osseous abnormality.
IMPRESSION: 1. Persistent bilateral pulmonary infiltrates, worsened in the left
upper lobe.

## 2022-05-24 DEATH — deceased
